# Patient Record
Sex: Female | Born: 1970 | Race: Black or African American | Hispanic: No | Marital: Single | Smoking: Never smoker
Health system: Southern US, Community
[De-identification: ages and names within clinical notes are randomized; demographics above are authoritative.]

## PROBLEM LIST (undated history)

## (undated) DIAGNOSIS — I10 Essential (primary) hypertension: Secondary | ICD-10-CM

## (undated) DIAGNOSIS — E282 Polycystic ovarian syndrome: Secondary | ICD-10-CM

## (undated) DIAGNOSIS — E119 Type 2 diabetes mellitus without complications: Secondary | ICD-10-CM

## (undated) HISTORY — PX: COLONOSCOPY: SHX174

## (undated) HISTORY — PX: UPPER GI ENDOSCOPY: SHX6162

---

## 2000-11-09 ENCOUNTER — Other Ambulatory Visit: Admission: RE | Admit: 2000-11-09 | Discharge: 2000-11-09 | Payer: Self-pay | Admitting: Gynecology

## 2001-11-03 ENCOUNTER — Encounter: Payer: Self-pay | Admitting: Emergency Medicine

## 2001-11-03 ENCOUNTER — Emergency Department (HOSPITAL_COMMUNITY): Admission: EM | Admit: 2001-11-03 | Discharge: 2001-11-03 | Payer: Self-pay | Admitting: Emergency Medicine

## 2002-04-15 ENCOUNTER — Other Ambulatory Visit: Admission: RE | Admit: 2002-04-15 | Discharge: 2002-04-15 | Payer: Self-pay | Admitting: Gynecology

## 2004-06-18 ENCOUNTER — Other Ambulatory Visit: Admission: RE | Admit: 2004-06-18 | Discharge: 2004-06-18 | Payer: Self-pay | Admitting: Gynecology

## 2004-08-25 ENCOUNTER — Emergency Department (HOSPITAL_COMMUNITY): Admission: EM | Admit: 2004-08-25 | Discharge: 2004-08-25 | Payer: Self-pay | Admitting: Family Medicine

## 2004-08-30 ENCOUNTER — Ambulatory Visit (HOSPITAL_COMMUNITY): Admission: RE | Admit: 2004-08-30 | Discharge: 2004-08-30 | Payer: Self-pay | Admitting: Family Medicine

## 2004-08-30 ENCOUNTER — Emergency Department (HOSPITAL_COMMUNITY): Admission: EM | Admit: 2004-08-30 | Discharge: 2004-08-30 | Payer: Self-pay | Admitting: Family Medicine

## 2005-03-06 ENCOUNTER — Emergency Department (HOSPITAL_COMMUNITY): Admission: EM | Admit: 2005-03-06 | Discharge: 2005-03-06 | Payer: Self-pay | Admitting: Family Medicine

## 2006-03-13 ENCOUNTER — Encounter: Admission: RE | Admit: 2006-03-13 | Discharge: 2006-03-13 | Payer: Self-pay | Admitting: Gynecology

## 2006-03-13 ENCOUNTER — Other Ambulatory Visit: Admission: RE | Admit: 2006-03-13 | Discharge: 2006-03-13 | Payer: Self-pay | Admitting: Gynecology

## 2007-07-30 ENCOUNTER — Other Ambulatory Visit: Admission: RE | Admit: 2007-07-30 | Discharge: 2007-07-30 | Payer: Self-pay | Admitting: Gynecology

## 2012-11-03 ENCOUNTER — Emergency Department (HOSPITAL_BASED_OUTPATIENT_CLINIC_OR_DEPARTMENT_OTHER): Payer: PRIVATE HEALTH INSURANCE

## 2012-11-03 ENCOUNTER — Encounter (HOSPITAL_BASED_OUTPATIENT_CLINIC_OR_DEPARTMENT_OTHER): Payer: Self-pay | Admitting: *Deleted

## 2012-11-03 ENCOUNTER — Emergency Department (HOSPITAL_BASED_OUTPATIENT_CLINIC_OR_DEPARTMENT_OTHER)
Admission: EM | Admit: 2012-11-03 | Discharge: 2012-11-03 | Disposition: A | Discharge status: Home / Self Care | Payer: PRIVATE HEALTH INSURANCE | Attending: Emergency Medicine | Admitting: Emergency Medicine

## 2012-11-03 DIAGNOSIS — Z8742 Personal history of other diseases of the female genital tract: Secondary | ICD-10-CM | POA: Insufficient documentation

## 2012-11-03 DIAGNOSIS — I10 Essential (primary) hypertension: Secondary | ICD-10-CM | POA: Insufficient documentation

## 2012-11-03 DIAGNOSIS — R112 Nausea with vomiting, unspecified: Secondary | ICD-10-CM | POA: Insufficient documentation

## 2012-11-03 DIAGNOSIS — R109 Unspecified abdominal pain: Secondary | ICD-10-CM | POA: Insufficient documentation

## 2012-11-03 DIAGNOSIS — N83209 Unspecified ovarian cyst, unspecified side: Secondary | ICD-10-CM | POA: Insufficient documentation

## 2012-11-03 DIAGNOSIS — R350 Frequency of micturition: Secondary | ICD-10-CM | POA: Insufficient documentation

## 2012-11-03 DIAGNOSIS — Z79899 Other long term (current) drug therapy: Secondary | ICD-10-CM | POA: Insufficient documentation

## 2012-11-03 DIAGNOSIS — R3915 Urgency of urination: Secondary | ICD-10-CM | POA: Insufficient documentation

## 2012-11-03 DIAGNOSIS — E119 Type 2 diabetes mellitus without complications: Secondary | ICD-10-CM | POA: Insufficient documentation

## 2012-11-03 DIAGNOSIS — F411 Generalized anxiety disorder: Secondary | ICD-10-CM | POA: Insufficient documentation

## 2012-11-03 DIAGNOSIS — Z3202 Encounter for pregnancy test, result negative: Secondary | ICD-10-CM | POA: Insufficient documentation

## 2012-11-03 HISTORY — DX: Essential (primary) hypertension: I10

## 2012-11-03 HISTORY — DX: Type 2 diabetes mellitus without complications: E11.9

## 2012-11-03 HISTORY — DX: Polycystic ovarian syndrome: E28.2

## 2012-11-03 LAB — COMPREHENSIVE METABOLIC PANEL
ALT: 15 U/L (ref 0–35)
AST: 12 U/L (ref 0–37)
Albumin: 3.9 g/dL (ref 3.5–5.2)
CO2: 25 mEq/L (ref 19–32)
Calcium: 10.2 mg/dL (ref 8.4–10.5)
Chloride: 97 mEq/L (ref 96–112)
GFR calc non Af Amer: >90 mL/min (ref >90)
Sodium: 134 mEq/L — ABNORMAL LOW (ref 135–145)
Total Bilirubin: 0.3 mg/dL (ref 0.3–1.2)

## 2012-11-03 LAB — URINE MICROSCOPIC-ADD ON

## 2012-11-03 LAB — URINALYSIS, ROUTINE W REFLEX MICROSCOPIC
Hgb urine dipstick: NEGATIVE
Leukocytes, UA: NEGATIVE
Nitrite: NEGATIVE
Specific Gravity, Urine: 1.024 (ref 1.005–1.030)
Urobilinogen, UA: 1 mg/dL (ref 0.0–1.0)

## 2012-11-03 LAB — PREGNANCY, URINE: Preg Test, Ur: NEGATIVE

## 2012-11-03 LAB — CBC WITH DIFFERENTIAL/PLATELET
Basophils Absolute: 0 10*3/uL (ref 0.0–0.1)
Basophils Relative: 0 % (ref 0–1)
Lymphocytes Relative: 22 % (ref 12–46)
Neutro Abs: 6.3 10*3/uL (ref 1.7–7.7)
Platelets: 423 10*3/uL — ABNORMAL HIGH (ref 150–400)
RDW: 16.4 % — ABNORMAL HIGH (ref 11.5–15.5)
WBC: 8.8 10*3/uL (ref 4.0–10.5)

## 2012-11-03 MED ORDER — FENTANYL CITRATE 0.05 MG/ML IJ SOLN
100.0000 ug | Freq: Once | INTRAMUSCULAR | Status: AC
  Administered 2012-11-03: 100 ug via INTRAVENOUS
  Filled 2012-11-03: qty 2

## 2012-11-03 MED ORDER — KETOROLAC TROMETHAMINE 30 MG/ML IJ SOLN
15.0000 mg | Freq: Once | INTRAMUSCULAR | Status: AC
  Administered 2012-11-03: 15 mg via INTRAVENOUS
  Filled 2012-11-03: qty 1

## 2012-11-03 MED ORDER — ONDANSETRON HCL 4 MG PO TABS: 4.0000 mg | ORAL_TABLET | Freq: Three times a day (TID) | ORAL | Status: DC | PRN

## 2012-11-03 MED ORDER — OXYCODONE-ACETAMINOPHEN 7.5-325 MG PO TABS: 1.0000 | ORAL_TABLET | ORAL | Status: DC | PRN

## 2012-11-03 MED ORDER — ONDANSETRON HCL 4 MG/2ML IJ SOLN
4.0000 mg | Freq: Once | INTRAMUSCULAR | Status: AC
  Administered 2012-11-03: 4 mg via INTRAVENOUS
  Filled 2012-11-03: qty 2

## 2012-11-03 NOTE — ED Provider Notes (Signed)
History  This chart was scribed for Nelia Shi, MD by Ardelia Mems, ED Scribe.   CSN: [id number] Arrival date & time 11/03/12  1422  First MD Initiated Contact with Patient 11/03/12 1543     Chief Complaint  Patient presents with  . Flank Pain     The history is provided by the patient. No language interpreter was used.   HPI Comments: Pamela Reese is a 42 y.o. Female with a hx of DM and HTN who presents to the Emergency Department complaining of constant, dull, right flank pain onset 2 days ago, with associated nausea and vomiting. She states that she is able to eat, but reports emesis 10 minutes after she eats, and she states that she can't keep her medications for HTN, diabetes and anxiety down. She also reports associated urinary frequency and urgency. She states that had similar flank pain about 5 years ago which subsided on its own. She states she has fibroids and polycystic ovaries. Pt denies hx of kidney stones, or gallbladder surgery. She states she suspected her pain was cramps, and she tried to sleep with legs elevated, without relief. Her pain is worsened while lying supine, and made better by walking. Pt states that she has taken Tylenol and Aspirin without relief of pain. She denies fever, chills, hematuria, left flank pain, abdominal pain or any other symptoms. She denies alcohol use and denies any hx of smoking.   Past Medical History  Diagnosis Date  . Diabetes mellitus without complication   . Hypertension   . Polycystic ovaries    History reviewed. No pertinent past surgical history. No family history on file. History  Substance Use Topics  . Smoking status: Never Smoker   . Smokeless tobacco: Not on file  . Alcohol Use: Not on file   OB History   Grav Para Term Preterm Abortions TAB SAB Ect Mult Living                 Review of Systems  Constitutional: Negative for fever and chills.  Respiratory: Negative for shortness of breath.   Cardiovascular:  Negative for chest pain.  Gastrointestinal: Positive for nausea and vomiting. Negative for abdominal pain.  Genitourinary: Positive for urgency, frequency and flank pain. Negative for dysuria.  A complete 10 system review of systems was obtained and all systems are negative except as noted in the HPI and PMH.   Allergies  Review of patient's allergies indicates no known allergies.  Home Medications   Current Outpatient Rx  Name  Route  Sig  Dispense  Refill  . benazepril (LOTENSIN) 40 MG tablet   Oral   Take 40 mg by mouth daily.         Marland Kitchen FLUoxetine (PROZAC) 10 MG capsule   Oral   Take 10 mg by mouth daily.         . metFORMIN (GLUCOPHAGE) 1000 MG tablet   Oral   Take 1,000 mg by mouth 2 (two) times daily with a meal.         . spironolactone (ALDACTONE) 100 MG tablet   Oral   Take 100 mg by mouth daily.          Triage Vitals: BP 154/89  Pulse 119  Temp(Src) 98.3 F (36.8 C) (Oral)  Resp 18  Ht 5\' 2"  (1.575 m)  Wt 298 lb (135.172 kg)  BMI 54.49 kg/m2  SpO2 98%  Physical Exam  Nursing note and vitals reviewed. Constitutional: She is oriented to  person, place, and time. She appears well-developed and well-nourished. No distress.  HENT:  Head: Normocephalic and atraumatic.  Eyes: Pupils are equal, round, and reactive to light.  Neck: Normal range of motion.  Cardiovascular: Normal rate and intact distal pulses.   Pulmonary/Chest: No respiratory distress.  Abdominal: Normal appearance. She exhibits no distension and no mass. There is no tenderness. There is no rebound and no guarding.  No right upper quadrant pain to deep palpation or inspiration.  Musculoskeletal: Normal range of motion.  Neurological: She is alert and oriented to person, place, and time. No cranial nerve deficit.  Skin: Skin is warm and dry. No rash noted.  Psychiatric: She has a normal mood and affect. Her behavior is normal.    ED Course  Procedures (including critical care  time) Medications  fentaNYL (SUBLIMAZE) injection 100 mcg (100 mcg Intravenous Given 11/03/12 1627)  ondansetron (ZOFRAN) injection 4 mg (4 mg Intravenous Given 11/03/12 1625)  ketorolac (TORADOL) 30 MG/ML injection 15 mg (15 mg Intravenous Given 11/03/12 1625)   DIAGNOSTIC STUDIES: Oxygen Saturation is 98% on RA, normal by my interpretation.    COORDINATION OF CARE: 3:50 PM- Pt informed of suspicion of a kidney stone and advised of plan for pain management along with diagnostic lab work and radiology and pt agrees. 5:18 PM- Recheck with pt and pt advised of lab and radiology results and interpretation. Pt informed that she does not have a kidney stone. Pt informed that she has bacteria in her urine, which will be cultured. Pt informed of right adnexal cyst, which may be causing her pain. Pt advised of plan to receive a prescription for pain medication upon discharge and pt agrees. Pt requests medication for nausea. Pt also agrees to follow-up with her PCP/gynecologist next week.   Medications  fentaNYL (SUBLIMAZE) injection 100 mcg (100 mcg Intravenous Given 11/03/12 1627)  ondansetron (ZOFRAN) injection 4 mg (4 mg Intravenous Given 11/03/12 1625)  ketorolac (TORADOL) 30 MG/ML injection 15 mg (15 mg Intravenous Given 11/03/12 1625)    Labs Reviewed  URINALYSIS, ROUTINE W REFLEX MICROSCOPIC - Abnormal; Notable for the following:    APPearance CLOUDY (*)    Glucose, UA 100 (*)    Protein, ur 100 (*)    All other components within normal limits  URINE MICROSCOPIC-ADD ON - Abnormal; Notable for the following:    Squamous Epithelial / LPF MANY (*)    Bacteria, UA MANY (*)    Casts HYALINE CASTS (*)    All other components within normal limits  CBC WITH DIFFERENTIAL - Abnormal; Notable for the following:    Hemoglobin 11.6 (*)    HCT 35.2 (*)    MCV 73.6 (*)    MCH 24.3 (*)    RDW 16.4 (*)    Platelets 423 (*)    All other components within normal limits  COMPREHENSIVE METABOLIC PANEL -  Abnormal; Notable for the following:    Sodium 134 (*)    Glucose, Bld 209 (*)    Total Protein 8.7 (*)    All other components within normal limits  URINE CULTURE  PREGNANCY, URINE  LIPASE, BLOOD   Ct Abdomen Pelvis Wo Contrast  11/03/2012   *RADIOLOGY REPORT*  Clinical Data: Right flank pain.  CT ABDOMEN AND PELVIS WITHOUT CONTRAST  Technique:  Multidetector CT imaging of the abdomen and pelvis was performed following the standard protocol without intravenous contrast.  Comparison: CT abdomen and pelvis 12/08/2006 and pelvic ultrasound 10/01/2012.  Findings: The lung bases  are clear.  No pleural or pericardial effusion.  No renal or ureteral stones are identified.  There is no hydronephrosis on the right or left.  The kidneys and urinary bladder appear normal.  The liver, gallbladder, spleen, adrenal glands and pancreas appear normal.  The stomach, small and large bowel and appendix are unremarkable.  IUD is seen the uterus.  Bilobed cystic structure is identified in the pelvis in the adnexa.  Right side component measures 4.7 cm transverse by 4.8 cm AP by 4.8 cm cranial-caudal. Left side component measures 3.0 cm transverse by 2.7 cm cranial- caudal. The right side the right side cystic component was present on the 2008 CT scan and appears unchanged.  No lymphadenopathy or fluid is identified.  IMPRESSION:  1.  Negative urinary tract stone.  No acute finding. 2.  Cystic adnexal structure correlates with finding described on the pelvic ultrasound 10/01/2012.  As noted on that study,  the lesion may represent a paraovarian cyst, enteric duplication cyst or lymphangioma.  As noted on that study, pelvic MRI with contrast could be used for further evaluation.   Original Report Authenticated By: Holley Dexter, M.D.    1. Flank pain   2. Ovarian cyst     MDM         I personally performed the services described in this documentation, which was scribed in my presence. The recorded information  has been reviewed and considered.    Nelia Shi, MD 11/14/12 [phone]

## 2012-11-03 NOTE — ED Notes (Signed)
Flank pain on right side for 2 days. N/V

## 2012-11-05 LAB — URINE CULTURE
Colony Count: NO GROWTH
Culture: NO GROWTH

## 2014-11-04 ENCOUNTER — Emergency Department (HOSPITAL_BASED_OUTPATIENT_CLINIC_OR_DEPARTMENT_OTHER)
Admission: EM | Admit: 2014-11-04 | Discharge: 2014-11-04 | Disposition: A | Discharge status: Home / Self Care | Payer: BLUE CROSS/BLUE SHIELD | Attending: Emergency Medicine | Admitting: Emergency Medicine

## 2014-11-04 ENCOUNTER — Encounter (HOSPITAL_BASED_OUTPATIENT_CLINIC_OR_DEPARTMENT_OTHER): Payer: Self-pay | Admitting: *Deleted

## 2014-11-04 DIAGNOSIS — Z86010 Personal history of colonic polyps: Secondary | ICD-10-CM | POA: Insufficient documentation

## 2014-11-04 DIAGNOSIS — I10 Essential (primary) hypertension: Secondary | ICD-10-CM | POA: Insufficient documentation

## 2014-11-04 DIAGNOSIS — E119 Type 2 diabetes mellitus without complications: Secondary | ICD-10-CM | POA: Diagnosis not present

## 2014-11-04 DIAGNOSIS — E860 Dehydration: Secondary | ICD-10-CM | POA: Insufficient documentation

## 2014-11-04 DIAGNOSIS — Z79899 Other long term (current) drug therapy: Secondary | ICD-10-CM | POA: Insufficient documentation

## 2014-11-04 DIAGNOSIS — R112 Nausea with vomiting, unspecified: Secondary | ICD-10-CM | POA: Diagnosis present

## 2014-11-04 LAB — COMPREHENSIVE METABOLIC PANEL
ALBUMIN: 4 g/dL (ref 3.5–5.0)
ALK PHOS: 86 U/L (ref 38–126)
ALT: 24 U/L (ref 14–54)
ANION GAP: 10 (ref 5–15)
AST: 23 U/L (ref 15–41)
BILIRUBIN TOTAL: 0.7 mg/dL (ref 0.3–1.2)
BUN: 9 mg/dL (ref 6–20)
CALCIUM: 9.2 mg/dL (ref 8.9–10.3)
CO2: 24 mmol/L (ref 22–32)
CREATININE: 0.97 mg/dL (ref 0.44–1.00)
Chloride: 98 mmol/L — ABNORMAL LOW (ref 101–111)
GFR calc Af Amer: >60 mL/min (ref >60)
GFR calc non Af Amer: >60 mL/min (ref >60)
GLUCOSE: 214 mg/dL — AB (ref 65–99)
POTASSIUM: 4 mmol/L (ref 3.5–5.1)
SODIUM: 132 mmol/L — AB (ref 135–145)
TOTAL PROTEIN: 8.7 g/dL — AB (ref 6.5–8.1)

## 2014-11-04 LAB — URINALYSIS, ROUTINE W REFLEX MICROSCOPIC
Glucose, UA: >1000 mg/dL — AB
HGB URINE DIPSTICK: NEGATIVE
KETONES UR: 40 mg/dL — AB
Leukocytes, UA: NEGATIVE
Nitrite: NEGATIVE
Protein, ur: 30 mg/dL — AB
SPECIFIC GRAVITY, URINE: 1.046 — AB (ref 1.005–1.030)
UROBILINOGEN UA: 1 mg/dL (ref 0.0–1.0)
pH: 5.5 (ref 5.0–8.0)

## 2014-11-04 LAB — LIPASE, BLOOD: LIPASE: 31 U/L (ref 22–51)

## 2014-11-04 LAB — CBC
HCT: 39.3 % (ref 36.0–46.0)
HEMOGLOBIN: 12.9 g/dL (ref 12.0–15.0)
MCH: 24.3 pg — AB (ref 26.0–34.0)
MCHC: 32.8 g/dL (ref 30.0–36.0)
MCV: 74.2 fL — AB (ref 78.0–100.0)
PLATELETS: 365 10*3/uL (ref 150–400)
RBC: 5.3 MIL/uL — AB (ref 3.87–5.11)
RDW: 16.3 % — AB (ref 11.5–15.5)
WBC: 5.2 10*3/uL (ref 4.0–10.5)

## 2014-11-04 LAB — URINE MICROSCOPIC-ADD ON

## 2014-11-04 MED ORDER — PROMETHAZINE HCL 25 MG RE SUPP: 25.0000 mg | Freq: Four times a day (QID) | RECTAL | Status: DC | PRN

## 2014-11-04 MED ORDER — ONDANSETRON HCL 4 MG/2ML IJ SOLN
4.0000 mg | Freq: Once | INTRAMUSCULAR | Status: AC
  Administered 2014-11-04: 4 mg via INTRAVENOUS
  Filled 2014-11-04: qty 2

## 2014-11-04 MED ORDER — METOCLOPRAMIDE HCL 10 MG PO TABS: 10.0000 mg | ORAL_TABLET | Freq: Four times a day (QID) | ORAL | Status: DC

## 2014-11-04 MED ORDER — METOCLOPRAMIDE HCL 5 MG/ML IJ SOLN
10.0000 mg | Freq: Once | INTRAMUSCULAR | Status: AC
  Administered 2014-11-04: 10 mg via INTRAVENOUS
  Filled 2014-11-04: qty 2

## 2014-11-04 MED ORDER — SODIUM CHLORIDE 0.9 % IV BOLUS (SEPSIS)
1000.0000 mL | Freq: Once | INTRAVENOUS | Status: AC
  Administered 2014-11-04: 1000 mL via INTRAVENOUS

## 2014-11-04 NOTE — ED Notes (Signed)
Pt. Is very nauseated after the fluid challenge just one sip.

## 2014-11-04 NOTE — ED Provider Notes (Signed)
CSN: [id number]     Arrival date & time 11/04/14  1034 History   First MD Initiated Contact with Patient 11/04/14 1037     Chief Complaint  Patient presents with  . Abdominal Pain     (Consider location/radiation/quality/duration/timing/severity/associated sxs/prior Treatment) HPI  Pt presents with c/o nausea and vomiting.  She states she has been nauseated for the past 2 weeks but over the past 5 days she has had several episodes of emesis.  She is able to drink gatorade but not water.  Has not been able to keep down solid foods.  No diarrhea.  Last BM was approx 7 days ago.  No abdominal pain.  No dysuria, urinary frequency or urgency.  States thinking about food makes her nauseated.  Had similar symptoms in the past and had EGD and colonoscopy performed, removed a polyp from her colon and stomach- has recheck EGD scheduled for September- otherwise no abnormalities found.  No sick contacts.  No fever/chills.  No vaginal bleeding or discharge.  There are no other associated systemic symptoms, there are no other alleviating or modifying factors.   Past Medical History  Diagnosis Date  . Diabetes mellitus without complication   . Hypertension   . Polycystic ovaries    History reviewed. No pertinent past surgical history. History reviewed. No pertinent family history. History  Substance Use Topics  . Smoking status: Never Smoker   . Smokeless tobacco: Not on file  . Alcohol Use: Not on file   OB History    No data available     Review of Systems  ROS reviewed and all otherwise negative except for mentioned in HPI    Allergies  Review of patient's allergies indicates no known allergies.  Home Medications   Prior to Admission medications   Medication Sig Start Date End Date Taking? Authorizing Provider  Exenatide (BYDUREON [place]) Inject into the skin.   Yes Historical Provider, MD  sitaGLIPtin (JANUVIA) 50 MG tablet Take 50 mg by mouth daily.   Yes Historical Provider, MD   benazepril (LOTENSIN) 40 MG tablet Take 40 mg by mouth daily.    Historical Provider, MD  metoCLOPramide (REGLAN) 10 MG tablet Take 1 tablet (10 mg total) by mouth every 6 (six) hours. 11/04/14   Jerelyn ScottMartha Linker, MD  ondansetron (ZOFRAN) 4 MG tablet Take 1 tablet (4 mg total) by mouth every 8 (eight) hours as needed for nausea. 11/03/12   Nelva Nayobert Beaton, MD  oxyCODONE-acetaminophen (PERCOCET) 7.5-325 MG per tablet Take 1 tablet by mouth every 4 (four) hours as needed for pain. 11/03/12   Nelva Nayobert Beaton, MD  promethazine (PHENERGAN) 25 MG suppository Place 1 suppository (25 mg total) rectally every 6 (six) hours as needed for nausea or vomiting. 11/04/14   Jerelyn ScottMartha Linker, MD  spironolactone (ALDACTONE) 100 MG tablet Take 100 mg by mouth daily.    Historical Provider, MD   BP 138/88 mmHg  Pulse 70  Temp(Src) 98 F (36.7 C) (Oral)  Resp 18  Ht 5\' 2"  (1.575 m)  Wt 286 lb (129.729 kg)  BMI 52.30 kg/m2  SpO2 99%  Vitals reviewed Physical Exam  Physical Examination: General appearance - alert, well appearing, and in no distress Mental status - alert, oriented to person, place, and time Eyes- no conjunctival injection, no scleral icterus Mouth - mucous membranes moist, pharynx normal without lesions Chest - clear to auscultation, no wheezes, rales or rhonchi, symmetric air entry Heart - normal rate, regular rhythm, normal S1, S2, no murmurs, rubs, clicks  or gallops Abdomen - soft, nontender, nondistended, no masses or organomegaly, nabs Neurological - alert, oriented, normal speech, no focal findings or movement disorder noted Extremities - peripheral pulses normal, no pedal edema, no clubbing or cyanosis Skin - normal coloration and turgor, no rashes  ED Course  Procedures (including critical care time) Labs Review Labs Reviewed  CBC - Abnormal; Notable for the following:    RBC 5.30 (*)    MCV 74.2 (*)    MCH 24.3 (*)    RDW 16.3 (*)    All other components within normal limits   COMPREHENSIVE METABOLIC PANEL - Abnormal; Notable for the following:    Sodium 132 (*)    Chloride 98 (*)    Glucose, Bld 214 (*)    Total Protein 8.7 (*)    All other components within normal limits  URINALYSIS, ROUTINE W REFLEX MICROSCOPIC (NOT AT [facility]) - Abnormal; Notable for the following:    Specific Gravity, Urine 1.046 (*)    Glucose, UA >1000 (*)    Bilirubin Urine SMALL (*)    Ketones, ur 40 (*)    Protein, ur 30 (*)    All other components within normal limits  LIPASE, BLOOD  URINE MICROSCOPIC-ADD ON    Imaging Review No results found.   EKG Interpretation None      MDM   Final diagnoses:  Nausea and vomiting, vomiting of unspecified type  Dehydration, mild    Pt presenting with several days of nausea and vomiting- pt treated with IV fluids, zofran- did not help much.  reglan did help with her nausea and she has been able to tolerate po challenge in the ED.  Labs are reasssuring, urine does show some concentration and ketones- should be improved after receiving IV fluids.  Pt has no hx of GERD, no gastritis or ulcer on EGD several months ago.  She has an appointment scheduled tomorrow with her GI doctor.  Abdominal exam is benign.  Discharged with strict return precautions.  Pt agreeable with plan.    Jerelyn Scott, MD 11/05/14 [phone]

## 2014-11-04 NOTE — ED Notes (Signed)
Pt. Reports feeling full just taking a sip of the gator aid of the fluid challenge.

## 2014-11-04 NOTE — ED Notes (Signed)
Patient denies pain and is resting comfortably.  

## 2014-11-04 NOTE — ED Notes (Signed)
Pt also reports constipation, "my last decent bm was over a week ago.Marland Kitchen.Marland Kitchen."

## 2014-11-04 NOTE — Discharge Instructions (Signed)
Return to the ED with any concerns including vomiting and not able to keep down liquids, abdominal pain-especially if it localizes to the right lower abdomen, difficulty breathing, fainting, decreased level of alertness/lethargy, or any other alarming symptoms

## 2014-11-04 NOTE — ED Notes (Signed)
Pt reports epigastric burning and pressure x July 7th with intermittent vomiting. Pt states "when I start to chew my food, I can feel the acid coming up..." pt states she had egd and colonoscopy in feb with polyps removed from colon and stomach. Pt states she has decreased appetite and po intake due to the burning and pressure causing constant nausea. Pt denies any pain, states "I just feel so bloated and full..."

## 2015-05-12 ENCOUNTER — Emergency Department (HOSPITAL_BASED_OUTPATIENT_CLINIC_OR_DEPARTMENT_OTHER)
Admission: EM | Admit: 2015-05-12 | Discharge: 2015-05-12 | Disposition: A | Discharge status: Home / Self Care | Payer: BLUE CROSS/BLUE SHIELD | Attending: Emergency Medicine | Admitting: Emergency Medicine

## 2015-05-12 ENCOUNTER — Encounter (HOSPITAL_BASED_OUTPATIENT_CLINIC_OR_DEPARTMENT_OTHER): Payer: Self-pay

## 2015-05-12 DIAGNOSIS — I10 Essential (primary) hypertension: Secondary | ICD-10-CM | POA: Diagnosis not present

## 2015-05-12 DIAGNOSIS — Z3202 Encounter for pregnancy test, result negative: Secondary | ICD-10-CM | POA: Insufficient documentation

## 2015-05-12 DIAGNOSIS — Z7984 Long term (current) use of oral hypoglycemic drugs: Secondary | ICD-10-CM | POA: Insufficient documentation

## 2015-05-12 DIAGNOSIS — Z79899 Other long term (current) drug therapy: Secondary | ICD-10-CM | POA: Diagnosis not present

## 2015-05-12 DIAGNOSIS — R140 Abdominal distension (gaseous): Secondary | ICD-10-CM | POA: Diagnosis not present

## 2015-05-12 DIAGNOSIS — E119 Type 2 diabetes mellitus without complications: Secondary | ICD-10-CM | POA: Insufficient documentation

## 2015-05-12 DIAGNOSIS — G43A0 Cyclical vomiting, not intractable: Secondary | ICD-10-CM | POA: Insufficient documentation

## 2015-05-12 DIAGNOSIS — Z9889 Other specified postprocedural states: Secondary | ICD-10-CM | POA: Insufficient documentation

## 2015-05-12 DIAGNOSIS — R112 Nausea with vomiting, unspecified: Secondary | ICD-10-CM | POA: Diagnosis present

## 2015-05-12 DIAGNOSIS — R1115 Cyclical vomiting syndrome unrelated to migraine: Secondary | ICD-10-CM

## 2015-05-12 LAB — COMPREHENSIVE METABOLIC PANEL
ALT: 16 U/L (ref 14–54)
ANION GAP: 12 (ref 5–15)
AST: 16 U/L (ref 15–41)
Albumin: 4.3 g/dL (ref 3.5–5.0)
Alkaline Phosphatase: 87 U/L (ref 38–126)
BUN: 9 mg/dL (ref 6–20)
CHLORIDE: 98 mmol/L — AB (ref 101–111)
CO2: 22 mmol/L (ref 22–32)
CREATININE: 0.92 mg/dL (ref 0.44–1.00)
Calcium: 9.4 mg/dL (ref 8.9–10.3)
GFR calc non Af Amer: >60 mL/min (ref >60)
Glucose, Bld: 229 mg/dL — ABNORMAL HIGH (ref 65–99)
POTASSIUM: 3.8 mmol/L (ref 3.5–5.1)
SODIUM: 132 mmol/L — AB (ref 135–145)
Total Bilirubin: 0.8 mg/dL (ref 0.3–1.2)
Total Protein: 8.9 g/dL — ABNORMAL HIGH (ref 6.5–8.1)

## 2015-05-12 LAB — URINE MICROSCOPIC-ADD ON

## 2015-05-12 LAB — CBC WITH DIFFERENTIAL/PLATELET
Basophils Absolute: 0.1 10*3/uL (ref 0.0–0.1)
Basophils Relative: 1 %
EOS ABS: 0.1 10*3/uL (ref 0.0–0.7)
EOS PCT: 1 %
HCT: 41.5 % (ref 36.0–46.0)
Hemoglobin: 13.9 g/dL (ref 12.0–15.0)
LYMPHS ABS: 3.5 10*3/uL (ref 0.7–4.0)
Lymphocytes Relative: 37 %
MCH: 24.8 pg — AB (ref 26.0–34.0)
MCHC: 33.5 g/dL (ref 30.0–36.0)
MCV: 74.1 fL — ABNORMAL LOW (ref 78.0–100.0)
Monocytes Absolute: 0.9 10*3/uL (ref 0.1–1.0)
Monocytes Relative: 9 %
NEUTROS PCT: 52 %
Neutro Abs: 4.9 10*3/uL (ref 1.7–7.7)
PLATELETS: 422 10*3/uL — AB (ref 150–400)
RBC: 5.6 MIL/uL — AB (ref 3.87–5.11)
RDW: 16.2 % — ABNORMAL HIGH (ref 11.5–15.5)
WBC: 9.4 10*3/uL (ref 4.0–10.5)

## 2015-05-12 LAB — URINALYSIS, ROUTINE W REFLEX MICROSCOPIC
Glucose, UA: >1000 mg/dL — AB
Hgb urine dipstick: NEGATIVE
Ketones, ur: >80 mg/dL — AB
LEUKOCYTES UA: NEGATIVE
NITRITE: NEGATIVE
PH: 6 (ref 5.0–8.0)
Protein, ur: 100 mg/dL — AB
SPECIFIC GRAVITY, URINE: 1.03 (ref 1.005–1.030)

## 2015-05-12 LAB — PREGNANCY, URINE: PREG TEST UR: NEGATIVE

## 2015-05-12 LAB — LIPASE, BLOOD: LIPASE: 29 U/L (ref 11–51)

## 2015-05-12 LAB — CBG MONITORING, ED: Glucose-Capillary: 218 mg/dL — ABNORMAL HIGH (ref 65–99)

## 2015-05-12 MED ORDER — METOCLOPRAMIDE HCL 5 MG/ML IJ SOLN
10.0000 mg | Freq: Once | INTRAMUSCULAR | Status: AC
  Administered 2015-05-12: 10 mg via INTRAVENOUS
  Filled 2015-05-12: qty 2

## 2015-05-12 MED ORDER — METOCLOPRAMIDE HCL 10 MG PO TABS: 10.0000 mg | ORAL_TABLET | Freq: Four times a day (QID) | ORAL | Status: DC

## 2015-05-12 MED ORDER — ONDANSETRON HCL 4 MG/2ML IJ SOLN
4.0000 mg | Freq: Once | INTRAMUSCULAR | Status: AC
  Administered 2015-05-12: 4 mg via INTRAVENOUS
  Filled 2015-05-12: qty 2

## 2015-05-12 NOTE — ED Provider Notes (Signed)
CSN: [id number]     Arrival date & time 05/12/15  1756 History   First MD Initiated Contact with Patient 05/12/15 1805     Chief Complaint  Patient presents with  . Vomiting   HPI  Pamela Reese is a 45 year old female with PMHx of HTN, PCOS and DM presenting with nausea and vomiting. She states that she began to lose her appetite approximately 2 weeks ago and had mild nausea. She states over the last 5 days her nausea has worsened and she is now vomiting all PO intake. She states that she is not able to "take even a sip of water ". Denies blood in her vomit. She endorses associated epigastric bloating but no abdominal pain. She states that she gets recurrent nausea and vomiting approximately every 3-4 months. She states that this presentation is similar to her previous episodes. She has been seen in this emergency Department before for a similar episode. She is followed by a gastroenterologist who has performed endoscopies and colonoscopies. She states that they have not found any cause of her cyclic nausea. She states that they have done gastroparesis testing which was also negative. She was given Phenergan and Zofran at her primary care provider's office today. She states that she took the Zofran but immediately vomited it up. She called her PCP office back and they instructed her to come to the emergency department for possible dehydration. She is no other complaints today. She denies fevers, chills, headaches, dizziness, syncope, visual disturbances, chest pain, shortness of breath, diarrhea, dysuria, flank pain, myalgias or arthralgias.   Past Medical History  Diagnosis Date  . Diabetes mellitus without complication (HCC)   . Hypertension   . Polycystic ovaries    Past Surgical History  Procedure Laterality Date  . Upper gi endoscopy    . Colonoscopy     No family history on file. Social History  Substance Use Topics  . Smoking status: Never Smoker   . Smokeless tobacco: None  .  Alcohol Use: None   OB History    No data available     Review of Systems  Gastrointestinal: Positive for nausea, vomiting and abdominal distention (bloating).  All other systems reviewed and are negative.     Allergies  Review of patient's allergies indicates no known allergies.  Home Medications   Prior to Admission medications   Medication Sig Start Date End Date Taking? Authorizing Provider  omeprazole (PRILOSEC) 10 MG capsule Take 10 mg by mouth daily.   Yes Historical Provider, MD  benazepril (LOTENSIN) 40 MG tablet Take 40 mg by mouth daily.    Historical Provider, MD  Exenatide (BYDUREON [place]) Inject into the skin.    Historical Provider, MD  metoCLOPramide (REGLAN) 10 MG tablet Take 1 tablet (10 mg total) by mouth every 6 (six) hours. 11/04/14   Jerelyn Scott, MD  metoCLOPramide (REGLAN) 10 MG tablet Take 1 tablet (10 mg total) by mouth every 6 (six) hours. 05/12/15   Lashaun Poch, PA-C  ondansetron (ZOFRAN) 4 MG tablet Take 1 tablet (4 mg total) by mouth every 8 (eight) hours as needed for nausea. 11/03/12   Nelva Nay, MD  oxyCODONE-acetaminophen (PERCOCET) 7.5-325 MG per tablet Take 1 tablet by mouth every 4 (four) hours as needed for pain. 11/03/12   Nelva Nay, MD  promethazine (PHENERGAN) 25 MG suppository Place 1 suppository (25 mg total) rectally every 6 (six) hours as needed for nausea or vomiting. 11/04/14   Jerelyn Scott, MD  sitaGLIPtin (  JANUVIA) 50 MG tablet Take 50 mg by mouth daily.    Historical Provider, MD  spironolactone (ALDACTONE) 100 MG tablet Take 100 mg by mouth daily.    Historical Provider, MD   BP 131/109 mmHg  Pulse 131  Temp(Src) 98.6 F (37 C) (Oral)  Resp 16  Ht  (1.575 m)  Wt 127.007 kg  BMI 51.20 kg/m2  SpO2 100% Physical Exam  Constitutional: She appears well-developed and well-nourished. No distress.  Nontoxic-appearing, obese   HENT:  Head: Normocephalic and atraumatic.  Eyes: Conjunctivae are normal. Right eye exhibits no  discharge. Left eye exhibits no discharge. No scleral icterus.  Neck: Normal range of motion.  Cardiovascular: Normal rate, regular rhythm and normal heart sounds.   Pulmonary/Chest: Effort normal and breath sounds normal. No respiratory distress.  Abdominal: Soft. Bowel sounds are normal. She exhibits no distension. There is no tenderness. There is no rebound and no guarding.  Musculoskeletal: Normal range of motion.  Neurological: She is alert. Coordination normal.  Skin: Skin is warm and dry.  Psychiatric: She has a normal mood and affect. Her behavior is normal.  Nursing note and vitals reviewed.   ED Course  Procedures (including critical care time) Labs Review Labs Reviewed  URINALYSIS, ROUTINE W REFLEX MICROSCOPIC (NOT AT [facility]) - Abnormal; Notable for the following:    APPearance CLOUDY (*)    Glucose, UA >1000 (*)    Bilirubin Urine MODERATE (*)    Ketones, ur >80 (*)    Protein, ur 100 (*)    All other components within normal limits  CBC WITH DIFFERENTIAL/PLATELET - Abnormal; Notable for the following:    RBC 5.60 (*)    MCV 74.1 (*)    MCH 24.8 (*)    RDW 16.2 (*)    Platelets 422 (*)    All other components within normal limits  COMPREHENSIVE METABOLIC PANEL - Abnormal; Notable for the following:    Sodium 132 (*)    Chloride 98 (*)    Glucose, Bld 229 (*)    Total Protein 8.9 (*)    All other components within normal limits  URINE MICROSCOPIC-ADD ON - Abnormal; Notable for the following:    Squamous Epithelial / LPF 6-30 (*)    Bacteria, UA FEW (*)    All other components within normal limits  CBG MONITORING, ED - Abnormal; Notable for the following:    Glucose-Capillary 218 (*)    All other components within normal limits  PREGNANCY, URINE  LIPASE, BLOOD    Imaging Review No results found. I have personally reviewed and evaluated these images and lab results as part of my medical decision-making.   EKG Interpretation None      MDM   Final  diagnoses:  Non-intractable cyclical vomiting with nausea   45 year old female presenting with nausea and vomiting x 1 week. Pt has similar episodes approximately every 3-4 months. She is followed by gastrology for this. She has had negative workups in the past secondary cause. Tachycardic to 131 which resolved after IV fluids. Abdomen is soft, nontender without peritoneal signs. No abdominal distention the patient is obese. Given 1 L bolus of fluids and Zofran. Patient reports Zofran distal to improve her nausea. Given Reglan which she reports moderate relief with. She reports a history of good response to Reglan. She is tolerating water in the emergency department. Blood work is reassuring. No indication for further evaluation due to extensive workup by GI. Given Reglan prescription and encouraged  to follow up with GI. Return precautions given in discharge paperwork and discussed with pt at bedside. Pt stable for discharge     Alveta Heimlich, PA-C 05/12/15 2038  Rolland Porter, MD 05/21/15 [phone]

## 2015-05-12 NOTE — Discharge Instructions (Signed)

## 2015-05-12 NOTE — ED Notes (Signed)
PA-C at bedside 

## 2015-05-12 NOTE — ED Notes (Signed)
Pt reports history of having n/v x 5 days. Sts she has had this before. Reports abdominal distention. Has been seen by PMD. Sent for concerns of dehydration.

## 2015-05-12 NOTE — ED Notes (Signed)
PA-C at bedside, Discussing results with patient.

## 2015-05-12 NOTE — ED Notes (Signed)
Pt reports intermittent vomiting x 2 weeks, acutely worse x several days.  Went to pcp, given nausea meds, informed to come to ED now b/c of possible dehydration.  Reports abd distention but denies specific pain.

## 2015-05-30 ENCOUNTER — Emergency Department (HOSPITAL_BASED_OUTPATIENT_CLINIC_OR_DEPARTMENT_OTHER): Payer: BLUE CROSS/BLUE SHIELD

## 2015-05-30 ENCOUNTER — Emergency Department (HOSPITAL_BASED_OUTPATIENT_CLINIC_OR_DEPARTMENT_OTHER)
Admission: EM | Admit: 2015-05-30 | Discharge: 2015-05-30 | Disposition: A | Discharge status: Home / Self Care | Payer: BLUE CROSS/BLUE SHIELD | Attending: Emergency Medicine | Admitting: Emergency Medicine

## 2015-05-30 ENCOUNTER — Encounter (HOSPITAL_BASED_OUTPATIENT_CLINIC_OR_DEPARTMENT_OTHER): Payer: Self-pay | Admitting: *Deleted

## 2015-05-30 DIAGNOSIS — Z79899 Other long term (current) drug therapy: Secondary | ICD-10-CM | POA: Insufficient documentation

## 2015-05-30 DIAGNOSIS — R079 Chest pain, unspecified: Secondary | ICD-10-CM

## 2015-05-30 DIAGNOSIS — Y998 Other external cause status: Secondary | ICD-10-CM | POA: Insufficient documentation

## 2015-05-30 DIAGNOSIS — R000 Tachycardia, unspecified: Secondary | ICD-10-CM | POA: Insufficient documentation

## 2015-05-30 DIAGNOSIS — I10 Essential (primary) hypertension: Secondary | ICD-10-CM | POA: Insufficient documentation

## 2015-05-30 DIAGNOSIS — S8992XA Unspecified injury of left lower leg, initial encounter: Secondary | ICD-10-CM | POA: Insufficient documentation

## 2015-05-30 DIAGNOSIS — S29001A Unspecified injury of muscle and tendon of front wall of thorax, initial encounter: Secondary | ICD-10-CM | POA: Insufficient documentation

## 2015-05-30 DIAGNOSIS — Z7984 Long term (current) use of oral hypoglycemic drugs: Secondary | ICD-10-CM | POA: Insufficient documentation

## 2015-05-30 DIAGNOSIS — Y9389 Activity, other specified: Secondary | ICD-10-CM | POA: Diagnosis not present

## 2015-05-30 DIAGNOSIS — Y92410 Unspecified street and highway as the place of occurrence of the external cause: Secondary | ICD-10-CM | POA: Diagnosis not present

## 2015-05-30 DIAGNOSIS — S3992XA Unspecified injury of lower back, initial encounter: Secondary | ICD-10-CM | POA: Diagnosis not present

## 2015-05-30 DIAGNOSIS — E119 Type 2 diabetes mellitus without complications: Secondary | ICD-10-CM | POA: Diagnosis not present

## 2015-05-30 DIAGNOSIS — S299XXA Unspecified injury of thorax, initial encounter: Secondary | ICD-10-CM | POA: Diagnosis present

## 2015-05-30 MED ORDER — KETOROLAC TROMETHAMINE 30 MG/ML IJ SOLN
30.0000 mg | Freq: Once | INTRAMUSCULAR | Status: AC
  Administered 2015-05-30: 30 mg via INTRAVENOUS
  Filled 2015-05-30: qty 1

## 2015-05-30 MED ORDER — LORAZEPAM 2 MG/ML IJ SOLN
1.0000 mg | Freq: Once | INTRAMUSCULAR | Status: AC
  Administered 2015-05-30: 1 mg via INTRAVENOUS
  Filled 2015-05-30: qty 1

## 2015-05-30 MED ORDER — IOHEXOL 300 MG/ML  SOLN
80.0000 mL | Freq: Once | INTRAMUSCULAR | Status: AC | PRN
Start: 2015-05-30 — End: 2015-05-30
  Administered 2015-05-30: 80 mL via INTRAVENOUS

## 2015-05-30 MED ORDER — ONDANSETRON HCL 4 MG/2ML IJ SOLN
4.0000 mg | Freq: Once | INTRAMUSCULAR | Status: AC
  Administered 2015-05-30: 4 mg via INTRAVENOUS
  Filled 2015-05-30: qty 2

## 2015-05-30 MED ORDER — METHOCARBAMOL 500 MG PO TABS: 1000.0000 mg | ORAL_TABLET | Freq: Four times a day (QID) | ORAL | Status: DC | PRN

## 2015-05-30 MED ORDER — OXYCODONE-ACETAMINOPHEN 7.5-325 MG PO TABS: 1.0000 | ORAL_TABLET | ORAL | Status: DC | PRN

## 2015-05-30 MED ORDER — MORPHINE SULFATE (PF) 4 MG/ML IV SOLN
4.0000 mg | Freq: Once | INTRAVENOUS | Status: AC
  Administered 2015-05-30: 4 mg via INTRAVENOUS
  Filled 2015-05-30: qty 1

## 2015-05-30 MED ORDER — SODIUM CHLORIDE 0.9 % IV BOLUS (SEPSIS)
1000.0000 mL | Freq: Once | INTRAVENOUS | Status: AC
  Administered 2015-05-30: 1000 mL via INTRAVENOUS
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
## 2015-05-30 NOTE — Discharge Instructions (Signed)
Take 4 over the counter ibuprofen tablets 3 times a day or 2 over-the-counter naproxen tablets twice a day for pain. ° °Chest Wall Pain °Chest wall pain is pain in or around the bones and muscles of your chest. Sometimes, an injury causes this pain. Sometimes, the cause may not be known. This pain may take several weeks or longer to get better. °HOME CARE °Pay attention to any changes in your symptoms. Take these actions to help with your pain: °· Rest as told by your doctor. °· Avoid activities that cause pain. Try not to use your chest, belly (abdominal), or side muscles to lift heavy things. °· If directed, apply ice to the painful area: °¨ Put ice in a plastic bag. °¨ Place a towel between your skin and the bag. °¨ Leave the ice on for 20 minutes, 2-3 times per day. °· Take over-the-counter and prescription medicines only as told by your doctor. °· Do not use tobacco products, including cigarettes, chewing tobacco, and e-cigarettes. If you need help quitting, ask your doctor. °· Keep all follow-up visits as told by your doctor. This is important. °GET HELP IF: °· You have a fever. °· Your chest pain gets worse. °· You have new symptoms. °GET HELP RIGHT AWAY IF: °· You feel sick to your stomach (nauseous) or you throw up (vomit). °· You feel sweaty or light-headed. °· You have a cough with phlegm (sputum) or you cough up blood. °· You are short of breath. °  °This information is not intended to replace advice given to you by your health care provider. Make sure you discuss any questions you have with your health care provider. °  °Document Released: 09/21/2007 Document Revised: 12/24/2014 Document Reviewed: 06/30/2014 °[employer]vier Interactive Patient Education ©2016 [employer] ° °
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
## 2015-05-30 NOTE — ED Notes (Signed)
DC instructions reviewed with pt, discussed using Ibuprofen for pain as recommended by EDP, also safety while taking PO pain meds as prescribed by EDP. Discussed times that may require returning to the ED. Opportunity for questions provided. Teach Back Method used

## 2016-02-26 ENCOUNTER — Emergency Department (HOSPITAL_BASED_OUTPATIENT_CLINIC_OR_DEPARTMENT_OTHER): Payer: BLUE CROSS/BLUE SHIELD

## 2016-02-26 ENCOUNTER — Emergency Department (HOSPITAL_BASED_OUTPATIENT_CLINIC_OR_DEPARTMENT_OTHER)
Admission: EM | Admit: 2016-02-26 | Discharge: 2016-02-26 | Disposition: A | Discharge status: Home / Self Care | Payer: BLUE CROSS/BLUE SHIELD | Attending: Emergency Medicine | Admitting: Emergency Medicine

## 2016-02-26 ENCOUNTER — Encounter (HOSPITAL_BASED_OUTPATIENT_CLINIC_OR_DEPARTMENT_OTHER): Payer: Self-pay | Admitting: Emergency Medicine

## 2016-02-26 DIAGNOSIS — Z79899 Other long term (current) drug therapy: Secondary | ICD-10-CM | POA: Diagnosis not present

## 2016-02-26 DIAGNOSIS — Y92410 Unspecified street and highway as the place of occurrence of the external cause: Secondary | ICD-10-CM | POA: Insufficient documentation

## 2016-02-26 DIAGNOSIS — Y999 Unspecified external cause status: Secondary | ICD-10-CM | POA: Insufficient documentation

## 2016-02-26 DIAGNOSIS — Y9389 Activity, other specified: Secondary | ICD-10-CM | POA: Insufficient documentation

## 2016-02-26 DIAGNOSIS — S4992XA Unspecified injury of left shoulder and upper arm, initial encounter: Secondary | ICD-10-CM | POA: Diagnosis present

## 2016-02-26 DIAGNOSIS — M546 Pain in thoracic spine: Secondary | ICD-10-CM | POA: Insufficient documentation

## 2016-02-26 DIAGNOSIS — M25512 Pain in left shoulder: Secondary | ICD-10-CM | POA: Diagnosis not present

## 2016-02-26 DIAGNOSIS — E119 Type 2 diabetes mellitus without complications: Secondary | ICD-10-CM | POA: Insufficient documentation

## 2016-02-26 DIAGNOSIS — I10 Essential (primary) hypertension: Secondary | ICD-10-CM | POA: Diagnosis not present

## 2016-02-26 MED ORDER — ACETAMINOPHEN 500 MG PO TABS
1000.0000 mg | ORAL_TABLET | Freq: Once | ORAL | Status: AC
  Administered 2016-02-26: 1000 mg via ORAL
  Filled 2016-02-26: qty 2

## 2016-02-26 MED ORDER — CYCLOBENZAPRINE HCL 10 MG PO TABS: 10.0000 mg | ORAL_TABLET | Freq: Two times a day (BID) | ORAL | 0 refills | Status: AC | PRN

## 2016-02-26 MED ORDER — CYCLOBENZAPRINE HCL 10 MG PO TABS
10.0000 mg | ORAL_TABLET | Freq: Once | ORAL | Status: AC
  Administered 2016-02-26: 10 mg via ORAL
  Filled 2016-02-26: qty 1

## 2016-02-26 MED ORDER — IBUPROFEN 800 MG PO TABS: 800.0000 mg | ORAL_TABLET | Freq: Three times a day (TID) | ORAL | 0 refills | Status: AC

## 2016-02-26 MED ORDER — IBUPROFEN 800 MG PO TABS
800.0000 mg | ORAL_TABLET | Freq: Once | ORAL | Status: AC
  Administered 2016-02-26: 800 mg via ORAL
  Filled 2016-02-26: qty 1

## 2016-02-26 MED FILL — CYCLOBENZAPRINE 10 MG TAB: 10 | 10 days supply | Qty: 20 | Fill #0

## 2016-02-26 MED FILL — IBUPROFEN 800 MG TABLET: 800 | 7 days supply | Qty: 21 | Fill #0

## 2016-02-26 NOTE — ED Notes (Signed)
Pt in XR at this time

## 2016-02-26 NOTE — Discharge Instructions (Signed)
Take 800 mg ibuprofen 3 times daily for pain. He may also take 1000 mg of Tylenol every 6 hours. Take Flexeril twice daily for muscle stiffness. Do not drive or operate heavy machinery.
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
## 2018-12-01 ENCOUNTER — Emergency Department (HOSPITAL_BASED_OUTPATIENT_CLINIC_OR_DEPARTMENT_OTHER)
Admission: EM | Admit: 2018-12-01 | Discharge: 2018-12-01 | Disposition: A | Discharge status: Home / Self Care | Payer: BLUE CROSS/BLUE SHIELD | Attending: Emergency Medicine | Admitting: Emergency Medicine

## 2018-12-01 ENCOUNTER — Other Ambulatory Visit: Payer: Self-pay

## 2018-12-01 ENCOUNTER — Emergency Department (HOSPITAL_BASED_OUTPATIENT_CLINIC_OR_DEPARTMENT_OTHER): Payer: BLUE CROSS/BLUE SHIELD

## 2018-12-01 ENCOUNTER — Encounter (HOSPITAL_BASED_OUTPATIENT_CLINIC_OR_DEPARTMENT_OTHER): Payer: Self-pay | Admitting: Emergency Medicine

## 2018-12-01 DIAGNOSIS — Z7984 Long term (current) use of oral hypoglycemic drugs: Secondary | ICD-10-CM | POA: Diagnosis not present

## 2018-12-01 DIAGNOSIS — R1013 Epigastric pain: Secondary | ICD-10-CM | POA: Insufficient documentation

## 2018-12-01 DIAGNOSIS — Z79899 Other long term (current) drug therapy: Secondary | ICD-10-CM | POA: Diagnosis not present

## 2018-12-01 DIAGNOSIS — I10 Essential (primary) hypertension: Secondary | ICD-10-CM | POA: Insufficient documentation

## 2018-12-01 DIAGNOSIS — E860 Dehydration: Secondary | ICD-10-CM | POA: Diagnosis not present

## 2018-12-01 DIAGNOSIS — R1010 Upper abdominal pain, unspecified: Secondary | ICD-10-CM | POA: Insufficient documentation

## 2018-12-01 DIAGNOSIS — E119 Type 2 diabetes mellitus without complications: Secondary | ICD-10-CM | POA: Insufficient documentation

## 2018-12-01 DIAGNOSIS — R112 Nausea with vomiting, unspecified: Secondary | ICD-10-CM | POA: Diagnosis not present

## 2018-12-01 DIAGNOSIS — R000 Tachycardia, unspecified: Secondary | ICD-10-CM | POA: Diagnosis not present

## 2018-12-01 DIAGNOSIS — R1011 Right upper quadrant pain: Secondary | ICD-10-CM | POA: Diagnosis present

## 2018-12-01 LAB — COMPREHENSIVE METABOLIC PANEL
ALT: 21 U/L (ref 0–44)
AST: 15 U/L (ref 15–41)
Albumin: 4.1 g/dL (ref 3.5–5.0)
Alkaline Phosphatase: 96 U/L (ref 38–126)
Anion gap: 16 — ABNORMAL HIGH (ref 5–15)
BUN: 11 mg/dL (ref 6–20)
CO2: 17 mmol/L — ABNORMAL LOW (ref 22–32)
Calcium: 9.2 mg/dL (ref 8.9–10.3)
Chloride: 104 mmol/L (ref 98–111)
Creatinine, Ser: 0.91 mg/dL (ref 0.44–1.00)
GFR calc Af Amer: >60 mL/min (ref >60)
GFR calc non Af Amer: >60 mL/min (ref >60)
Glucose, Bld: 267 mg/dL — ABNORMAL HIGH (ref 70–99)
Potassium: 4 mmol/L (ref 3.5–5.1)
Sodium: 137 mmol/L (ref 135–145)
Total Bilirubin: 0.7 mg/dL (ref 0.3–1.2)
Total Protein: 9.2 g/dL — ABNORMAL HIGH (ref 6.5–8.1)

## 2018-12-01 LAB — POCT I-STAT EG7
Acid-base deficit: 5 mmol/L — ABNORMAL HIGH (ref 0.0–2.0)
Bicarbonate: 21 mmol/L (ref 20.0–28.0)
Calcium, Ion: 1.16 mmol/L (ref 1.15–1.40)
HCT: 45 % (ref 36.0–46.0)
Hemoglobin: 15.3 g/dL — ABNORMAL HIGH (ref 12.0–15.0)
O2 Saturation: 59 %
Patient temperature: 98.6
Potassium: 4.1 mmol/L (ref 3.5–5.1)
Sodium: 141 mmol/L (ref 135–145)
TCO2: 22 mmol/L (ref 22–32)
pCO2, Ven: 39.7 mmHg — ABNORMAL LOW (ref 44.0–60.0)
pH, Ven: 7.331 (ref 7.250–7.430)
pO2, Ven: 33 mmHg (ref 32.0–45.0)

## 2018-12-01 LAB — URINALYSIS, MICROSCOPIC (REFLEX): WBC, UA: NONE SEEN WBC/hpf (ref 0–5)

## 2018-12-01 LAB — CBC
HCT: 45.9 % (ref 36.0–46.0)
Hemoglobin: 14.1 g/dL (ref 12.0–15.0)
MCH: 25 pg — ABNORMAL LOW (ref 26.0–34.0)
MCHC: 30.7 g/dL (ref 30.0–36.0)
MCV: 81.4 fL (ref 80.0–100.0)
Platelets: 406 10*3/uL — ABNORMAL HIGH (ref 150–400)
RBC: 5.64 MIL/uL — ABNORMAL HIGH (ref 3.87–5.11)
RDW: 15 % (ref 11.5–15.5)
WBC: 10.8 10*3/uL — ABNORMAL HIGH (ref 4.0–10.5)
nRBC: 0 % (ref 0.0–0.2)

## 2018-12-01 LAB — URINALYSIS, ROUTINE W REFLEX MICROSCOPIC
Glucose, UA: >=500 mg/dL — AB
Ketones, ur: >80 mg/dL — AB
Leukocytes,Ua: NEGATIVE
Nitrite: NEGATIVE
Protein, ur: 30 mg/dL — AB
Specific Gravity, Urine: 1.025 (ref 1.005–1.030)
pH: 5.5 (ref 5.0–8.0)

## 2018-12-01 LAB — LIPASE, BLOOD: Lipase: 23 U/L (ref 11–51)

## 2018-12-01 LAB — PREGNANCY, URINE: Preg Test, Ur: NEGATIVE

## 2018-12-01 MED ORDER — ONDANSETRON 8 MG PO TBDP: 8.0000 mg | ORAL_TABLET | Freq: Three times a day (TID) | ORAL | 0 refills | Status: DC | PRN

## 2018-12-01 MED ORDER — SODIUM CHLORIDE 0.9 % IV BOLUS (SEPSIS)
1000.0000 mL | Freq: Once | INTRAVENOUS | Status: AC
  Administered 2018-12-01: 1000 mL via INTRAVENOUS

## 2018-12-01 MED ORDER — METOCLOPRAMIDE HCL 5 MG/ML IJ SOLN
10.0000 mg | Freq: Once | INTRAMUSCULAR | Status: AC
  Administered 2018-12-01: 12:00:00 10 mg via INTRAVENOUS
  Filled 2018-12-01: qty 2

## 2018-12-01 MED ORDER — AMLODIPINE BESYLATE 5 MG PO TABS
10.0000 mg | ORAL_TABLET | Freq: Once | ORAL | Status: DC
  Filled 2018-12-01: qty 2

## 2018-12-01 MED ORDER — HALOPERIDOL LACTATE 5 MG/ML IJ SOLN
1.0000 mg | Freq: Once | INTRAMUSCULAR | Status: AC
  Administered 2018-12-01: 1 mg via INTRAVENOUS
  Filled 2018-12-01: qty 1

## 2018-12-01 MED ORDER — SODIUM CHLORIDE 0.9 % IV SOLN: 1000.0000 mL | INTRAVENOUS | Status: DC

## 2018-12-01 MED ORDER — ONDANSETRON HCL 4 MG/2ML IJ SOLN
4.0000 mg | Freq: Once | INTRAMUSCULAR | Status: AC
  Administered 2018-12-01: 4 mg via INTRAVENOUS
  Filled 2018-12-01: qty 2

## 2018-12-01 MED ORDER — LACTATED RINGERS IV BOLUS
1000.0000 mL | Freq: Once | INTRAVENOUS | Status: AC
  Administered 2018-12-01: 1000 mL via INTRAVENOUS

## 2018-12-01 NOTE — ED Notes (Signed)
Pt states she feels much better and is ready to go. EDP made aware

## 2018-12-01 NOTE — ED Triage Notes (Signed)
Pt reports generalized abd pain since Wednesday. Vomiting and nausea since yesterday. She saw her PCP yesterday.

## 2018-12-01 NOTE — ED Notes (Signed)
Patient transported to Ultrasound 

## 2018-12-01 NOTE — Discharge Instructions (Signed)
Take the medications to help with the nausea and vomiting.  Make sure to take your blood pressure and diabetes medications.  Return to the ED for worsening symptoms.

## 2018-12-01 NOTE — ED Provider Notes (Signed)
[facility] EMERGENCY DEPARTMENT Provider Note   CSN: [id number] Arrival date & time: 12/01/18  9379    History   Chief Complaint Chief Complaint  Patient presents with   Abdominal Pain    HPI Pamela Reese is a 48 y.o. female.     HPI Patient presents to the emergency room for evaluation of abdominal pain.  Patient states she started having symptoms on Wednesday.  She was having nausea vomiting and upper abdominal pain.  She saw her primary care doctor who ordered laboratory tests and an outpatient ultrasound.  Patient was feeling better so she decided to go to [employer] and get something to eat.  Patient thinks that might of been the wrong thing to do.  Since yesterday she has been having trouble with persistent vomiting and nausea.  She has not been able to keep anything down.  She has been having pain in her upper abdomen.  She also has noted some urinary frequency but denies any fevers or chills.  No lower abdominal pain.  No vaginal bleeding or discharge. Past Medical History:  Diagnosis Date   Diabetes mellitus without complication ([place])    Hypertension    Polycystic ovaries     There are no active problems to display for this patient.   Past Surgical History:  Procedure Laterality Date   COLONOSCOPY     UPPER GI ENDOSCOPY       OB History   No obstetric history on file.      Home Medications    Prior to Admission medications   Medication Sig Start Date End Date Taking? Authorizing Provider  amLODipine (NORVASC) 10 MG tablet Take 10 mg by mouth daily.    [provider]  cyclobenzaprine (FLEXERIL) 10 MG tablet Take 1 tablet (10 mg total) by mouth 2 (two) times daily as needed for muscle spasms. 02/26/16   Gloriann Loan, PA-C  empagliflozin (JARDIANCE) 25 MG TABS tablet Take 25 mg by mouth daily.    [provider]  Exenatide (BYDUREON [place]) Inject into the skin.    [provider]  ibuprofen (ADVIL,MOTRIN) 800 MG  tablet Take 1 tablet (800 mg total) by mouth 3 (three) times daily. 02/26/16   Gloriann Loan, PA-C  ondansetron (ZOFRAN ODT) 8 MG disintegrating tablet Take 1 tablet (8 mg total) by mouth every 8 (eight) hours as needed for nausea or vomiting. 12/01/18   Dorie Rank, MD  spironolactone (ALDACTONE) 100 MG tablet Take 100 mg by mouth daily.    [provider]    Family History No family history on file.  Social History Social History   Tobacco Use   Smoking status: Never Smoker   Smokeless tobacco: Never Used  Substance Use Topics   Alcohol use: Not on file   Drug use: Not on file     Allergies   Patient has no known allergies.   Review of Systems Review of Systems  All other systems reviewed and are negative.    Physical Exam Updated Vital Signs BP (!) 175/107 (BP Location: Left Arm)    Pulse (!) 126    Temp 98.6 F (37 C) (Oral)    Resp 18    Ht 1.575 m (5\' 2" )    Wt 123.4 kg    LMP 11/08/2018    SpO2 98%    BMI 49.75 kg/m   Physical Exam Vitals signs and nursing note reviewed.  Constitutional:      General: She is not in acute distress.  Appearance: She is well-developed. She is obese.  HENT:     Head: Normocephalic and atraumatic.     Right Ear: External ear normal.     Left Ear: External ear normal.  Eyes:     General: No scleral icterus.       Right eye: No discharge.        Left eye: No discharge.     Conjunctiva/sclera: Conjunctivae normal.  Neck:     Musculoskeletal: Neck supple.     Trachea: No tracheal deviation.  Cardiovascular:     Rate and Rhythm: Normal rate and regular rhythm.  Pulmonary:     Effort: Pulmonary effort is normal. No respiratory distress.     Breath sounds: Normal breath sounds. No stridor. No wheezing or rales.  Abdominal:     General: Bowel sounds are normal. There is no distension.     Palpations: Abdomen is soft.     Tenderness: There is abdominal tenderness in the right upper quadrant and epigastric area. There  is no guarding or rebound.  Musculoskeletal:        General: No tenderness.  Skin:    General: Skin is warm and dry.     Findings: No rash.  Neurological:     Mental Status: She is alert.     Cranial Nerves: No cranial nerve deficit (no facial droop, extraocular movements intact, no slurred speech).     Sensory: No sensory deficit.     Motor: No abnormal muscle tone or seizure activity.     Coordination: Coordination normal.      ED Treatments / Results  Labs (all labs ordered are listed, but only abnormal results are displayed) Labs Reviewed  COMPREHENSIVE METABOLIC PANEL - Abnormal; Notable for the following components:      Result Value   CO2 17 (*)    Glucose, Bld 267 (*)    Total Protein 9.2 (*)    Anion gap 16 (*)    All other components within normal limits  CBC - Abnormal; Notable for the following components:   WBC 10.8 (*)    RBC 5.64 (*)    MCH 25.0 (*)    Platelets 406 (*)    All other components within normal limits  URINALYSIS, ROUTINE W REFLEX MICROSCOPIC - Abnormal; Notable for the following components:   Glucose, UA >=500 (*)    Hgb urine dipstick TRACE (*)    Bilirubin Urine SMALL (*)    Ketones, ur >80 (*)    Protein, ur 30 (*)    All other components within normal limits  URINALYSIS, MICROSCOPIC (REFLEX) - Abnormal; Notable for the following components:   Bacteria, UA FEW (*)    All other components within normal limits  POCT I-STAT EG7 - Abnormal; Notable for the following components:   pCO2, Ven 39.7 (*)    Acid-base deficit 5.0 (*)    Hemoglobin 15.3 (*)    All other components within normal limits  LIPASE, BLOOD  PREGNANCY, URINE  I-STAT VENOUS BLOOD GAS, ED    EKG EKG Interpretation  Date/Time:  Saturday December 01 2018 15:09:44 EDT Ventricular Rate:  125 PR Interval:    QRS Duration: 90 QT Interval:  323 QTC Calculation: 466 R Axis:   62 Text Interpretation:  Sinus tachycardia Borderline T wave abnormalities No significant change  since last tracing Confirmed by Linwood DibblesKnapp, Nealy Hickmon [phone]) on 12/01/2018 3:16:30 PM   Radiology [place] Abdomen Complete  Result Date: 12/01/2018 CLINICAL DATA:  Abdominal pain and nausea  and vomiting for 1 week. EXAM: ABDOMEN ULTRASOUND COMPLETE COMPARISON:  None. FINDINGS: Gallbladder: No gallstones or wall thickening visualized. No sonographic Murphy sign noted by sonographer. Common bile duct: Diameter: 2 mm, within normal limits. Liver: No focal lesion identified. Within normal limits in parenchymal echogenicity. Portal vein is patent on color Doppler imaging with normal direction of blood flow towards the liver. IVC: No abnormality visualized. Pancreas: Visualized portion unremarkable. Spleen: Size and appearance within normal limits. Right Kidney: Length: 13.1 cm. Echogenicity within normal limits. No mass or hydronephrosis visualized. Left Kidney: Length: 11.5 cm. Echogenicity within normal limits. No mass or hydronephrosis. Abdominal aorta: No aneurysm visualized. Other findings: None. IMPRESSION: Negative abdomen ultrasound. Electronically Signed   By: Danae OrleansJohn A Stahl M.D.   On: 12/01/2018 10:52    Procedures Procedures (including critical care time)  Medications Ordered in ED Medications  sodium chloride 0.9 % bolus 1,000 mL (0 mLs Intravenous Stopped 12/01/18 1155)    Followed by  0.9 %  sodium chloride infusion (0 mLs Intravenous Hold 12/01/18 0946)  amLODipine (NORVASC) tablet 10 mg (has no administration in time range)  ondansetron (ZOFRAN) injection 4 mg (4 mg Intravenous Given 12/01/18 0934)  metoCLOPramide (REGLAN) injection 10 mg (10 mg Intravenous Given 12/01/18 1229)  lactated ringers bolus 1,000 mL (0 mLs Intravenous Stopped 12/01/18 1430)  haloperidol lactate (HALDOL) injection 1 mg (1 mg Intravenous Given 12/01/18 1333)     Initial Impression / Assessment and Plan / ED Course  I have reviewed the triage vital signs and the nursing notes.  Pertinent labs & imaging results that were  available during my care of the patient were reviewed by me and considered in my medical decision making (see chart for details).  Clinical Course as of Nov 30 1516  Sat Dec 01, 2018  1220 Patient does have an anion gap acidosis.  She is on oral hypoglycemics.  I will give her another fluid bolus and check venous blood gas   [JK]  1305 Venous blood gas does not show any signs of acidosis.   [JK]  1452 Patient is feeling better now after treatment.  She feels ready for discharge.   [JK]    Clinical Course User Index [JK] Linwood DibblesKnapp, Jozlyn Schatz, MD     Patient's laboratory tests were notable for elevated ketones in the urine as well as a decrease in the bicarb.  She did have an anion gap metabolic acidosis.  Patient's venous pH did not show any evidence of acidosis after IV hydration.  I doubt DKA.  Suspect she had a metabolic acidosis with ketosis from her lack of p.o. intake.  Patient did have an ultrasound to evaluate for possible cholelithiasis/cholecystitis.  Ultrasound is reassuring.  Patient symptoms did not improve with treatment in the emergency room.  She was able to tolerate fluids and had no further vomiting.  Patient was noted to be tachycardic.  EKG demonstrates a sinus tachycardic.  Previous EKGs showed that she had similar heart rate back even a couple years ago.  Patient was given a dose of her antihypertensive agents.  She was encouraged to return to the emergency room for any worsening symptoms.  Otherwise encourage close follow-up with her primary care doctor. Final Clinical Impressions(s) / ED Diagnoses   Final diagnoses:  Nausea and vomiting, intractability of vomiting not specified, unspecified vomiting type  Dehydration  Hypertension, unspecified type  Tachycardia    ED Discharge Orders         Ordered    ondansetron (  ZOFRAN ODT) 8 MG disintegrating tablet  Every 8 hours PRN     12/01/18 1517           Linwood DibblesKnapp, Ileane Sando, MD 12/01/18 1520

## 2018-12-01 NOTE — ED Notes (Signed)
ED Provider at bedside. 

## 2019-01-13 ENCOUNTER — Emergency Department (HOSPITAL_BASED_OUTPATIENT_CLINIC_OR_DEPARTMENT_OTHER)
Admission: EM | Admit: 2019-01-13 | Discharge: 2019-01-14 | Disposition: A | Discharge status: Home / Self Care | Payer: BC Managed Care – PPO | Attending: Emergency Medicine | Admitting: Emergency Medicine

## 2019-01-13 ENCOUNTER — Emergency Department (HOSPITAL_BASED_OUTPATIENT_CLINIC_OR_DEPARTMENT_OTHER): Payer: BC Managed Care – PPO

## 2019-01-13 ENCOUNTER — Encounter (HOSPITAL_BASED_OUTPATIENT_CLINIC_OR_DEPARTMENT_OTHER): Payer: Self-pay | Admitting: *Deleted

## 2019-01-13 ENCOUNTER — Other Ambulatory Visit: Payer: Self-pay

## 2019-01-13 DIAGNOSIS — E119 Type 2 diabetes mellitus without complications: Secondary | ICD-10-CM | POA: Diagnosis not present

## 2019-01-13 DIAGNOSIS — Z79899 Other long term (current) drug therapy: Secondary | ICD-10-CM | POA: Insufficient documentation

## 2019-01-13 DIAGNOSIS — R112 Nausea with vomiting, unspecified: Secondary | ICD-10-CM | POA: Diagnosis not present

## 2019-01-13 DIAGNOSIS — I10 Essential (primary) hypertension: Secondary | ICD-10-CM | POA: Insufficient documentation

## 2019-01-13 DIAGNOSIS — R1013 Epigastric pain: Secondary | ICD-10-CM | POA: Insufficient documentation

## 2019-01-13 LAB — URINALYSIS, ROUTINE W REFLEX MICROSCOPIC
Bilirubin Urine: NEGATIVE
Glucose, UA: >=500 mg/dL — AB
Ketones, ur: >80 mg/dL — AB
Leukocytes,Ua: NEGATIVE
Nitrite: NEGATIVE
Protein, ur: NEGATIVE mg/dL
Specific Gravity, Urine: 1.015 (ref 1.005–1.030)
pH: 5.5 (ref 5.0–8.0)

## 2019-01-13 LAB — CBC WITH DIFFERENTIAL/PLATELET
Abs Immature Granulocytes: 0.04 10*3/uL (ref 0.00–0.07)
Basophils Absolute: 0.1 10*3/uL (ref 0.0–0.1)
Basophils Relative: 1 %
Eosinophils Absolute: 0 10*3/uL (ref 0.0–0.5)
Eosinophils Relative: 0 %
HCT: 46 % (ref 36.0–46.0)
Hemoglobin: 14.5 g/dL (ref 12.0–15.0)
Immature Granulocytes: 0 %
Lymphocytes Relative: 23 %
Lymphs Abs: 2.1 10*3/uL (ref 0.7–4.0)
MCH: 25.3 pg — ABNORMAL LOW (ref 26.0–34.0)
MCHC: 31.5 g/dL (ref 30.0–36.0)
MCV: 80.4 fL (ref 80.0–100.0)
Monocytes Absolute: 0.6 10*3/uL (ref 0.1–1.0)
Monocytes Relative: 6 %
Neutro Abs: 6.4 10*3/uL (ref 1.7–7.7)
Neutrophils Relative %: 70 %
Platelets: 404 10*3/uL — ABNORMAL HIGH (ref 150–400)
RBC: 5.72 MIL/uL — ABNORMAL HIGH (ref 3.87–5.11)
RDW: 15.8 % — ABNORMAL HIGH (ref 11.5–15.5)
WBC: 9.2 10*3/uL (ref 4.0–10.5)
nRBC: 0 % (ref 0.0–0.2)

## 2019-01-13 LAB — COMPREHENSIVE METABOLIC PANEL
ALT: 17 U/L (ref 0–44)
AST: 14 U/L — ABNORMAL LOW (ref 15–41)
Albumin: 4.4 g/dL (ref 3.5–5.0)
Alkaline Phosphatase: 87 U/L (ref 38–126)
Anion gap: 19 — ABNORMAL HIGH (ref 5–15)
BUN: 20 mg/dL (ref 6–20)
CO2: 18 mmol/L — ABNORMAL LOW (ref 22–32)
Calcium: 9.7 mg/dL (ref 8.9–10.3)
Chloride: 98 mmol/L (ref 98–111)
Creatinine, Ser: 1.01 mg/dL — ABNORMAL HIGH (ref 0.44–1.00)
GFR calc Af Amer: >60 mL/min (ref >60)
GFR calc non Af Amer: >60 mL/min (ref >60)
Glucose, Bld: 272 mg/dL — ABNORMAL HIGH (ref 70–99)
Potassium: 4.2 mmol/L (ref 3.5–5.1)
Sodium: 135 mmol/L (ref 135–145)
Total Bilirubin: 0.7 mg/dL (ref 0.3–1.2)
Total Protein: 9.3 g/dL — ABNORMAL HIGH (ref 6.5–8.1)

## 2019-01-13 LAB — URINALYSIS, MICROSCOPIC (REFLEX)

## 2019-01-13 LAB — PREGNANCY, URINE: Preg Test, Ur: NEGATIVE

## 2019-01-13 LAB — CBG MONITORING, ED: Glucose-Capillary: 248 mg/dL — ABNORMAL HIGH (ref 70–99)

## 2019-01-13 LAB — LIPASE, BLOOD: Lipase: 29 U/L (ref 11–51)

## 2019-01-13 MED ORDER — METOCLOPRAMIDE HCL 5 MG/ML IJ SOLN
10.0000 mg | Freq: Once | INTRAMUSCULAR | Status: AC
  Administered 2019-01-13: 23:00:00 10 mg via INTRAVENOUS
  Filled 2019-01-13: qty 2

## 2019-01-13 MED ORDER — LACTATED RINGERS IV BOLUS
1000.0000 mL | Freq: Once | INTRAVENOUS | Status: AC
  Administered 2019-01-13: 23:00:00 1000 mL via INTRAVENOUS

## 2019-01-13 MED ORDER — LACTATED RINGERS IV BOLUS
1000.0000 mL | Freq: Once | INTRAVENOUS | Status: AC
  Administered 2019-01-14: 01:00:00 1000 mL via INTRAVENOUS

## 2019-01-13 NOTE — ED Notes (Signed)
Pt taken to radiology

## 2019-01-13 NOTE — ED Provider Notes (Signed)
[facility] EMERGENCY DEPARTMENT Provider Note   CSN: [id number] Arrival date & time: 01/13/19  2216     History   Chief Complaint Chief Complaint  Patient presents with  . Emesis    HPI Pamela Reese is a 48 y.o. female.     HPI  48 year old female presents with vomiting.  She is been vomiting for the past 2 days.  She states she is been feeling poorly for about a week where she describes nausea and decreased appetite but no vomiting until a couple days ago.  No diarrhea.  She states she had very similar episode about a month ago and was seen in the hospital and then followed up with GI and was told she was constipated and needed take a laxative.  She has been feeling worse and worse and tonight her blood sugar was reading low.  She came here without any type of treatment.  She denies any headache, fever, cough, shortness of breath, chest pain or urinary symptoms.  No abdominal pain but she feels bloated.  Past Medical History:  Diagnosis Date  . Diabetes mellitus without complication (H[facility])   . Hypertension   . Polycystic ovaries     There are no active problems to display for this patient.   Past Surgical History:  Procedure Laterality Date  . COLONOSCOPY    . UPPER GI ENDOSCOPY       OB History   No obstetric history on file.      Home Medications    Prior to Admission medications   Medication Sig Start Date End Date Taking? Authorizing Provider  amLODipine (NORVASC) 10 MG tablet Take 10 mg by mouth daily.   Yes [provider]  empagliflozin (JARDIANCE) 25 MG TABS tablet Take 25 mg by mouth daily.   Yes [provider]  spironolactone (ALDACTONE) 100 MG tablet Take 100 mg by mouth daily.   Yes [provider]  cyclobenzaprine (FLEXERIL) 10 MG tablet Take 1 tablet (10 mg total) by mouth 2 (two) times daily as needed for muscle spasms. 02/26/16   Cheri Fowler, PA-C  Exenatide (BYDUREON [place]) Inject into the skin.    [provider]  ibuprofen (ADVIL,MOTRIN) 800 MG tablet Take 1 tablet (800 mg total) by mouth 3 (three) times daily. 02/26/16   Cheri Fowler, PA-C  ondansetron (ZOFRAN ODT) 8 MG disintegrating tablet Take 1 tablet (8 mg total) by mouth every 8 (eight) hours as needed for nausea or vomiting. 12/01/18   Linwood Dibbles, MD    Family History No family history on file.  Social History Social History   Tobacco Use  . Smoking status: Never Smoker  . Smokeless tobacco: Never Used  Substance Use Topics  . Alcohol use: Never    Frequency: Never  . Drug use: Never     Allergies   Patient has no known allergies.   Review of Systems Review of Systems  Constitutional: Positive for fatigue. Negative for fever.  Respiratory: Negative for shortness of breath.   Cardiovascular: Negative for chest pain.  Gastrointestinal: Positive for abdominal pain, nausea and vomiting. Negative for constipation and diarrhea.  Genitourinary: Negative for dysuria.  Neurological: Negative for headaches.  All other systems reviewed and are negative.    Physical Exam Updated Vital Signs BP (!) 150/103 (BP Location: Right Arm)   Pulse (!) 116   Temp 97.8 F (36.6 C) (Oral)   Resp 20   Ht 5\' 2"  (1.575 m)   Wt 118.8 kg  LMP 12/16/2018 (Approximate)   SpO2 100%   BMI 47.92 kg/m   Physical Exam Vitals signs and nursing note reviewed.  Constitutional:      Appearance: She is well-developed. She is obese.  HENT:     Head: Normocephalic and atraumatic.     Right Ear: External ear normal.     Left Ear: External ear normal.     Nose: Nose normal.  Eyes:     General:        Right eye: No discharge.        Left eye: No discharge.  Cardiovascular:     Rate and Rhythm: Regular rhythm. Tachycardia present.     Heart sounds: Normal heart sounds.  Pulmonary:     Effort: Pulmonary effort is normal.     Breath sounds: Normal breath sounds.  Abdominal:     Palpations: Abdomen is soft.     Tenderness: There is  abdominal tenderness in the epigastric area.  Skin:    General: Skin is warm and dry.  Neurological:     Mental Status: She is alert.  Psychiatric:        Mood and Affect: Mood is not anxious.      ED Treatments / Results  Labs (all labs ordered are listed, but only abnormal results are displayed) Labs Reviewed  CBG MONITORING, ED - Abnormal; Notable for the following components:      Result Value   Glucose-Capillary 248 (*)    All other components within normal limits  COMPREHENSIVE METABOLIC PANEL  PREGNANCY, URINE  URINALYSIS, ROUTINE W REFLEX MICROSCOPIC  LIPASE, BLOOD  CBC WITH DIFFERENTIAL/PLATELET    EKG EKG Interpretation  Date/Time:  Sunday January 13 2019 23:07:15 EDT Ventricular Rate:  115 PR Interval:    QRS Duration: 88 QT Interval:  344 QTC Calculation: 476 R Axis:   54 Text Interpretation:  Sinus tachycardia Borderline T wave abnormalities Baseline wander in lead(s) II aVR V2 no significant change since Aug 2020 Confirmed by Sherwood Gambler [phone]) on 01/13/2019 11:14:39 PM   Radiology No results found.  Procedures Procedures (including critical care time)  Medications Ordered in ED Medications  lactated ringers bolus 1,000 mL (has no administration in time range)  metoCLOPramide (REGLAN) injection 10 mg (has no administration in time range)     Initial Impression / Assessment and Plan / ED Course  I have reviewed the triage vital signs and the nursing notes.  Pertinent labs & imaging results that were available during my care of the patient were reviewed by me and considered in my medical decision making (see chart for details).        Patient with multiple episodes of vomiting and abdominal bloating.  Abdominal exam shows some mild epigastric tenderness but is more that it makes her want to vomit then true tenderness.  This is very similar to August 2020.  At this point, will send labs, give fluids and antiemetics and reassess.  Care  transferred to Dr. Dina Rich.  Final Clinical Impressions(s) / ED Diagnoses   Final diagnoses:  None    ED Discharge Orders    None       Sherwood Gambler, MD 01/13/19 2354

## 2019-01-13 NOTE — ED Notes (Signed)
Imaging after u preg results 

## 2019-01-13 NOTE — ED Triage Notes (Signed)
Pt reports she has been feeling bad since Wednesday. Started vomiting Thursday. States she feels weak and has been sweating. C/o bloatng. States she checked her blood sugar tonight and it read 28 on her home meter. Pt did not eat or drink after that before coming to the ED due to nausea

## 2019-01-14 ENCOUNTER — Encounter (HOSPITAL_BASED_OUTPATIENT_CLINIC_OR_DEPARTMENT_OTHER): Payer: Self-pay

## 2019-01-14 ENCOUNTER — Emergency Department (HOSPITAL_BASED_OUTPATIENT_CLINIC_OR_DEPARTMENT_OTHER): Payer: BC Managed Care – PPO

## 2019-01-14 LAB — BASIC METABOLIC PANEL
Anion gap: 15 (ref 5–15)
BUN: 15 mg/dL (ref 6–20)
CO2: 19 mmol/L — ABNORMAL LOW (ref 22–32)
Calcium: 9.2 mg/dL (ref 8.9–10.3)
Chloride: 100 mmol/L (ref 98–111)
Creatinine, Ser: 0.7 mg/dL (ref 0.44–1.00)
GFR calc Af Amer: >60 mL/min (ref >60)
GFR calc non Af Amer: >60 mL/min (ref >60)
Glucose, Bld: 262 mg/dL — ABNORMAL HIGH (ref 70–99)
Potassium: 4.7 mmol/L (ref 3.5–5.1)
Sodium: 134 mmol/L — ABNORMAL LOW (ref 135–145)

## 2019-01-14 MED ORDER — IOHEXOL 300 MG/ML  SOLN
100.0000 mL | Freq: Once | INTRAMUSCULAR | Status: AC | PRN
  Administered 2019-01-14: 03:00:00 100 mL via INTRAVENOUS

## 2019-01-14 MED ORDER — ONDANSETRON HCL 4 MG/2ML IJ SOLN
4.0000 mg | Freq: Once | INTRAMUSCULAR | Status: AC
  Administered 2019-01-14: 01:00:00 4 mg via INTRAVENOUS
  Filled 2019-01-14: qty 2

## 2019-01-14 MED ORDER — METOCLOPRAMIDE HCL 10 MG PO TABS: 10.0000 mg | ORAL_TABLET | Freq: Three times a day (TID) | ORAL | 0 refills | Status: AC | PRN

## 2019-01-14 NOTE — ED Notes (Signed)
Pt returned from CT scan.

## 2019-01-14 NOTE — ED Provider Notes (Signed)
Patient signed out pending work-up.  Lab work notable for evidence of dehydration with greater than 80 ketones in the urine.  She does have an anion gap of 19.  She is a type II diabetic on oral meds.  She previously had similar BMP but a reassuring VBG.  Given concern for dehydration, suspect this may be the culprit and not DKA.  Patient was given several liters of fluid.  She did have some persistent nausea and vomiting while the emergency room.  CT scan was obtained to rule out intra-abdominal process.  CT scan does not show any pathology.  She had mild persistent tachycardia.  Repeat BMP with resolved anion gap.  Suspect her metabolic derangements likely secondary to dehydration.  I discussed with the patient that I have high suspicion that she may be experiencing some gastroparesis given her uncontrolled diabetes.  We will start her on Reglan and have encouraged her to follow-up with gastroenterology.  Additionally she needs to follow-up with her primary physician for recheck of her blood sugars and titration of medications.  Patient stated understanding.  Clinical Course as of Jan 13 534  Mon Jan 14, 2019  0321 On multiple rechecks, patient with persistent bloated feeling and recurrent emesis after antiemetics.  Will obtain CT of the abdomen.   [CH]    Clinical Course User Index [CH] Daemon Dowty, Barbette Hair, MD   Problem List Items Addressed This Visit    None    Visit Diagnoses    Non-intractable vomiting with nausea, unspecified vomiting type    -  Primary        Merryl Hacker, MD 01/14/19 [phone]

## 2019-01-14 NOTE — ED Notes (Signed)
MD with pt  

## 2019-01-14 NOTE — ED Notes (Signed)
Pt ambulatory to bathroom without difficulty.  

## 2019-01-14 NOTE — Discharge Instructions (Addendum)
You were seen today for nausea and vomiting.  Your work-up is overall reassuring.  Given your diabetes, you may have some element of gastroparesis.  Take medications as prescribed.  Follow-up closely with your primary physician.  He also need to follow-up with your gastroenterologist.

## 2019-01-14 NOTE — ED Notes (Signed)
Dr. Dina Rich aware that pt's heart rate remains in the 120s. ST. No further orders noted.

## 2019-08-18 ENCOUNTER — Other Ambulatory Visit: Payer: Self-pay

## 2019-08-18 ENCOUNTER — Encounter (HOSPITAL_BASED_OUTPATIENT_CLINIC_OR_DEPARTMENT_OTHER): Payer: Self-pay

## 2019-08-18 ENCOUNTER — Emergency Department (HOSPITAL_BASED_OUTPATIENT_CLINIC_OR_DEPARTMENT_OTHER)
Admission: EM | Admit: 2019-08-18 | Discharge: 2019-08-18 | Disposition: A | Discharge status: Home / Self Care | Payer: BC Managed Care – PPO | Attending: Emergency Medicine | Admitting: Emergency Medicine

## 2019-08-18 DIAGNOSIS — R109 Unspecified abdominal pain: Secondary | ICD-10-CM

## 2019-08-18 DIAGNOSIS — Z79899 Other long term (current) drug therapy: Secondary | ICD-10-CM | POA: Diagnosis not present

## 2019-08-18 DIAGNOSIS — R000 Tachycardia, unspecified: Secondary | ICD-10-CM | POA: Diagnosis not present

## 2019-08-18 DIAGNOSIS — E119 Type 2 diabetes mellitus without complications: Secondary | ICD-10-CM | POA: Diagnosis not present

## 2019-08-18 DIAGNOSIS — Z886 Allergy status to analgesic agent status: Secondary | ICD-10-CM | POA: Diagnosis not present

## 2019-08-18 DIAGNOSIS — I10 Essential (primary) hypertension: Secondary | ICD-10-CM | POA: Diagnosis not present

## 2019-08-18 DIAGNOSIS — R002 Palpitations: Secondary | ICD-10-CM | POA: Diagnosis present

## 2019-08-18 DIAGNOSIS — R112 Nausea with vomiting, unspecified: Secondary | ICD-10-CM | POA: Diagnosis not present

## 2019-08-18 DIAGNOSIS — E876 Hypokalemia: Secondary | ICD-10-CM | POA: Insufficient documentation

## 2019-08-18 DIAGNOSIS — Z888 Allergy status to other drugs, medicaments and biological substances status: Secondary | ICD-10-CM | POA: Insufficient documentation

## 2019-08-18 DIAGNOSIS — R1084 Generalized abdominal pain: Secondary | ICD-10-CM | POA: Diagnosis not present

## 2019-08-18 DIAGNOSIS — Z7984 Long term (current) use of oral hypoglycemic drugs: Secondary | ICD-10-CM | POA: Diagnosis not present

## 2019-08-18 LAB — CBC
HCT: 44.5 % (ref 36.0–46.0)
Hemoglobin: 14.5 g/dL (ref 12.0–15.0)
MCH: 25.9 pg — ABNORMAL LOW (ref 26.0–34.0)
MCHC: 32.6 g/dL (ref 30.0–36.0)
MCV: 79.5 fL — ABNORMAL LOW (ref 80.0–100.0)
Platelets: 342 10*3/uL (ref 150–400)
RBC: 5.6 MIL/uL — ABNORMAL HIGH (ref 3.87–5.11)
RDW: 15.7 % — ABNORMAL HIGH (ref 11.5–15.5)
WBC: 7.1 10*3/uL (ref 4.0–10.5)
nRBC: 0 % (ref 0.0–0.2)

## 2019-08-18 LAB — COMPREHENSIVE METABOLIC PANEL
ALT: 20 U/L (ref 0–44)
AST: 16 U/L (ref 15–41)
Albumin: 4.1 g/dL (ref 3.5–5.0)
Alkaline Phosphatase: 84 U/L (ref 38–126)
Anion gap: 15 (ref 5–15)
BUN: 14 mg/dL (ref 6–20)
CO2: 19 mmol/L — ABNORMAL LOW (ref 22–32)
Calcium: 9.1 mg/dL (ref 8.9–10.3)
Chloride: 97 mmol/L — ABNORMAL LOW (ref 98–111)
Creatinine, Ser: 0.81 mg/dL (ref 0.44–1.00)
GFR calc Af Amer: >60 mL/min (ref >60)
GFR calc non Af Amer: >60 mL/min (ref >60)
Glucose, Bld: 174 mg/dL — ABNORMAL HIGH (ref 70–99)
Potassium: 3.4 mmol/L — ABNORMAL LOW (ref 3.5–5.1)
Sodium: 131 mmol/L — ABNORMAL LOW (ref 135–145)
Total Bilirubin: 0.9 mg/dL (ref 0.3–1.2)
Total Protein: 8.9 g/dL — ABNORMAL HIGH (ref 6.5–8.1)

## 2019-08-18 LAB — URINALYSIS, MICROSCOPIC (REFLEX)

## 2019-08-18 LAB — URINALYSIS, ROUTINE W REFLEX MICROSCOPIC
Glucose, UA: >=500 mg/dL — AB
Ketones, ur: >80 mg/dL — AB
Leukocytes,Ua: NEGATIVE
Nitrite: NEGATIVE
Protein, ur: 30 mg/dL — AB
Specific Gravity, Urine: >1.03 — ABNORMAL HIGH (ref 1.005–1.030)
pH: 6 (ref 5.0–8.0)

## 2019-08-18 LAB — LIPASE, BLOOD: Lipase: 29 U/L (ref 11–51)

## 2019-08-18 MED ORDER — ONDANSETRON HCL 4 MG/2ML IJ SOLN
4.0000 mg | Freq: Once | INTRAMUSCULAR | Status: AC
  Administered 2019-08-18: 4 mg via INTRAVENOUS
  Filled 2019-08-18: qty 2

## 2019-08-18 MED ORDER — POTASSIUM CHLORIDE CRYS ER 20 MEQ PO TBCR
40.0000 meq | EXTENDED_RELEASE_TABLET | Freq: Once | ORAL | Status: AC
  Administered 2019-08-18: 19:00:00 40 meq via ORAL
  Filled 2019-08-18: qty 2

## 2019-08-18 MED ORDER — ONDANSETRON 4 MG PO TBDP: 4.0000 mg | ORAL_TABLET | Freq: Three times a day (TID) | ORAL | 0 refills | Status: AC | PRN

## 2019-08-18 MED ORDER — LIDOCAINE VISCOUS HCL 2 % MT SOLN
15.0000 mL | Freq: Once | OROMUCOSAL | Status: AC
  Administered 2019-08-18: 17:00:00 15 mL via ORAL
  Filled 2019-08-18: qty 15

## 2019-08-18 MED ORDER — ALUM & MAG HYDROXIDE-SIMETH 200-200-20 MG/5ML PO SUSP
30.0000 mL | Freq: Once | ORAL | Status: AC
  Administered 2019-08-18: 17:00:00 30 mL via ORAL
  Filled 2019-08-18: qty 30

## 2019-08-18 MED ORDER — FAMOTIDINE 20 MG PO TABS: 20.0000 mg | ORAL_TABLET | Freq: Two times a day (BID) | ORAL | 0 refills | Status: AC

## 2019-08-18 MED ORDER — SODIUM CHLORIDE 0.9 % IV BOLUS
1000.0000 mL | Freq: Once | INTRAVENOUS | Status: AC
  Administered 2019-08-18: 16:00:00 1000 mL via INTRAVENOUS

## 2019-08-18 NOTE — ED Notes (Signed)
Pt discharged to home. Discharge instructions have been discussed with patient and/or family members. Pt verbally acknowledges understanding d/c instructions, and endorses comprehension to checkout at registration before leaving.  °

## 2019-08-18 NOTE — ED Provider Notes (Signed)
[facility] EMERGENCY DEPARTMENT Provider Note   CSN: [id number] Arrival date & time: 08/18/19  1402     History Chief Complaint  Patient presents with   Palpitations    Pamela Reese is a 49 y.o. female with history of diabetes mellitus, hypertension, gastroparesis, polycystic ovaries presents for evaluation of acute onset, persistent nausea and vomiting beginning today.  She reports she has a history of gastroparesis and she feels that it is flaring up today.  She had similar symptoms earlier last week and was seen by her PCP for it on Wednesday, 08/14/2019 where she was given a shot of IM Phenergan which she reports was very helpful.  Blood work was done at that time which showed hyperglycemia but no evidence of DKA or HHS.  Her symptoms resolved after her visit on Wednesday but then "came back with a vengeance" per the patient today.  She was sent home with a prescription for Reglan but states she has not been able to keep this down.  She has had 5 or 6 episodes of nonbloody nonbilious emesis.  She reports feeling as though her abdomen is bloated and she only gets relief when she vomits.  She has been mildly constipated but had a bowel movement yesterday.  Denies melena or hematochezia, urinary symptoms, fevers or chills.  She denies chest pain or shortness of breath.  Denies suspicious food intake or sick contacts.   The history is provided by the patient.       Past Medical History:  Diagnosis Date   Diabetes mellitus without complication (HCC)    Hypertension    Polycystic ovaries     There are no problems to display for this patient.   Past Surgical History:  Procedure Laterality Date   COLONOSCOPY     UPPER GI ENDOSCOPY       OB History   No obstetric history on file.     No family history on file.  Social History   Tobacco Use   Smoking status: Never Smoker   Smokeless tobacco: Never Used  Substance Use Topics   Alcohol use: Never    Drug use: Never    Home Medications Prior to Admission medications   Medication Sig Start Date End Date Taking? Authorizing Provider  amLODipine (NORVASC) 10 MG tablet Take 10 mg by mouth daily.   Yes [provider]  empagliflozin (JARDIANCE) 25 MG TABS tablet Take 25 mg by mouth daily.   Yes [provider]  spironolactone (ALDACTONE) 100 MG tablet Take 100 mg by mouth daily.   Yes [provider]  cyclobenzaprine (FLEXERIL) 10 MG tablet Take 1 tablet (10 mg total) by mouth 2 (two) times daily as needed for muscle spasms. 02/26/16   Cheri Fowler, PA-C  Exenatide (BYDUREON [place]) Inject into the skin.    [provider]  famotidine (PEPCID) 20 MG tablet Take 1 tablet (20 mg total) by mouth 2 (two) times daily. 08/18/19   Casper Pagliuca A, PA-C  ibuprofen (ADVIL,MOTRIN) 800 MG tablet Take 1 tablet (800 mg total) by mouth 3 (three) times daily. 02/26/16   Cheri Fowler, PA-C  metoCLOPramide (REGLAN) 10 MG tablet Take 1 tablet (10 mg total) by mouth every 8 (eight) hours as needed for nausea or vomiting. 01/14/19   Horton, Mayer Masker, MD  ondansetron (ZOFRAN ODT) 4 MG disintegrating tablet Take 1 tablet (4 mg total) by mouth every 8 (eight) hours as needed for nausea or vomiting. 08/18/19   Jeanie Sewer,  PA-C    Allergies    Other, Metformin, Semaglutide, Ace inhibitors, and Canagliflozin-metformin hcl  Review of Systems   Review of Systems  Constitutional: Negative for chills and fever.  Respiratory: Negative for shortness of breath.   Cardiovascular: Negative for chest pain.  Gastrointestinal: Positive for abdominal pain, constipation, nausea and vomiting. Negative for diarrhea.  All other systems reviewed and are negative.   Physical Exam Updated Vital Signs BP (!) 137/93 (BP Location: Right Arm)    Pulse (!) 127    Temp 98.9 F (37.2 C) (Oral)    Resp 20    Ht 5\' 2"  (1.575 m)    Wt 129.3 kg    LMP 08/14/2019    SpO2 100%    BMI 52.13 kg/m   Physical  Exam Vitals and nursing note reviewed.  Constitutional:      General: She is not in acute distress.    Appearance: She is well-developed. She is obese.  HENT:     Head: Normocephalic and atraumatic.  Eyes:     General:        Right eye: No discharge.        Left eye: No discharge.     Conjunctiva/sclera: Conjunctivae normal.  Neck:     Vascular: No JVD.     Trachea: No tracheal deviation.  Cardiovascular:     Rate and Rhythm: Regular rhythm. Tachycardia present.     Heart sounds: Normal heart sounds.  Pulmonary:     Effort: Pulmonary effort is normal.     Breath sounds: Normal breath sounds.  Abdominal:     General: Abdomen is protuberant. Bowel sounds are normal. There is no distension.     Palpations: Abdomen is soft.     Tenderness: There is abdominal tenderness in the right upper quadrant, epigastric area and left upper quadrant. There is no right CVA tenderness, left CVA tenderness, guarding or rebound.  Skin:    General: Skin is warm and dry.     Findings: No erythema.  Neurological:     Mental Status: She is alert.  Psychiatric:        Behavior: Behavior normal.     ED Results / Procedures / Treatments   Labs (all labs ordered are listed, but only abnormal results are displayed) Labs Reviewed  COMPREHENSIVE METABOLIC PANEL - Abnormal; Notable for the following components:      Result Value   Sodium 131 (*)    Potassium 3.4 (*)    Chloride 97 (*)    CO2 19 (*)    Glucose, Bld 174 (*)    Total Protein 8.9 (*)    All other components within normal limits  CBC - Abnormal; Notable for the following components:   RBC 5.60 (*)    MCV 79.5 (*)    MCH 25.9 (*)    RDW 15.7 (*)    All other components within normal limits  URINALYSIS, ROUTINE W REFLEX MICROSCOPIC - Abnormal; Notable for the following components:   Specific Gravity, Urine >1.030 (*)    Glucose, UA >=500 (*)    Hgb urine dipstick SMALL (*)    Bilirubin Urine SMALL (*)    Ketones, ur >80 (*)     Protein, ur 30 (*)    All other components within normal limits  URINALYSIS, MICROSCOPIC (REFLEX) - Abnormal; Notable for the following components:   Bacteria, UA RARE (*)    All other components within normal limits  LIPASE, BLOOD    EKG EKG  Interpretation  Date/Time:  Sunday Aug 18 2019 14:10:23 EDT Ventricular Rate:  109 PR Interval:  140 QRS Duration: 86 QT Interval:  346 QTC Calculation: 465 R Axis:   64 Text Interpretation: Sinus tachycardia Nonspecific T wave abnormality Abnormal ECG No significant change since last tracing Confirmed by Haviland, Julie ([id number]) on 08/18/2019 2:23:15 PM Also confirmed by Haviland, Julie ([id number]), editor [place], Beverly ([id number])  on 08/19/2019 2:38:41 PM   Radiology No results found.  Procedures Procedures (including critical care time)  Medications Ordered in ED Medications  ondansetron (ZOFRAN) injection 4 mg (4 mg Intravenous Given 08/18/19 1554)  sodium chloride 0.9 % bolus 1,000 mL (0 mLs Intravenous Stopped 08/18/19 1729)  alum & mag hydroxide-simeth (MAALOX/MYLANTA) 200-200-20 MG/5ML suspension 30 mL (30 mLs Oral Given 08/18/19 1652)    And  lidocaine (XYLOCAINE) 2 % viscous mouth solution 15 mL (15 mLs Oral Given 08/18/19 1652)  potassium chloride SA (KLOR-CON) CR tablet 40 mEq (40 mEq Oral Given 08/18/19 1833)    ED Course  I have reviewed the triage vital signs and the nursing notes.  Pertinent labs & imaging results that were available during my care of the patient were reviewed by me and considered in my medical decision making (see chart for details).    MDM Rules/Calculators/A&P                      Patient presenting for evaluation of nausea, vomiting, upper abdominal pains.  She is afebrile, tachycardic in the ED but is chronically tachycardic per my assessment she frequently has heart rates of up to 130.  Vital signs are otherwise stable.  She is nontoxic in appearance.  She is upper abdominal pain with no rebound or guarding.   She feels that her symptoms are very consistent with prior gastroparesis flares.  Will obtain lab work and provide medications for symptomatic management and reassess.  Lab work reviewed and interpreted by myself shows no leukocytosis, no anemia, no renal insufficiency.  She is very mildly hypokalemic, hypochloremic and hyponatremic but anion gap is within normal limits.  I suspect these changes are due to the vomiting.  UA suggests dehydration, less concerning for UTI or nephrolithiasis.  She was given IV fluids, antiemetics in the ED and on reevaluation reports that she is feeling much better.  She is tolerating sips of fluid without difficulty.  Serial abdominal examinations remain benign.  I do not feel that she requires emergent imaging given reassuring serial exams and improvement in her symptoms with known history of gastroparesis.  She feels comfortable with discharge home.  Will discharge with symptomatic management.  Recommend follow-up with PCP or gastroenterologist for reevaluation of symptoms.  Discussed strict ED return precautions. Patient verbalized understanding of and agreement with plan and is safe for discharge home at this time.  Final Clinical Impression(s) / ED Diagnoses Final diagnoses:  Non-intractable vomiting with nausea, unspecified vomiting type  Abdominal discomfort  Sinus tachycardia  Hypokalemia    Rx / DC Orders ED Discharge Orders         Ordered    ondansetron (ZOFRAN ODT) 4 MG disintegrating tablet  Every 8 hours PRN     08/18/19 1827    famotidine (PEPCID) 20 MG tablet  2 times daily     05 /02/21 1827           08-30-1992, PA-C 08/19/19 2318    10/19/19, MD 08/21/19 1501

## 2019-08-18 NOTE — ED Triage Notes (Signed)
Pt states that she has been having nausea since Tuesday reports palpitations starting today. Pt also reports she is having cold sweats with dizziness and feels bloated.

## 2019-08-18 NOTE — Discharge Instructions (Addendum)
1. Medications: Take Zofran as needed for nausea.  Let this medicine dissolve under your tongue and wait around 10-20 minutes before eating or drinking after taking this medication.  Take famotidine twice daily with meals. 2. Treatment: rest, drink plenty of fluids, advance diet slowly.  Start with water and broth then advance to bland foods that will not upset your stomach such as crackers, mashed potatoes, and peanut butter. 3. Follow Up: Please followup with your primary doctor or gastroenterology in 3 days for discussion of your diagnoses and further evaluation after today's visit; please follow-up with your PCP to discuss your fast heart rate to see if there may need to be any additional work-up for this; Please return to the ER for persistent vomiting, high fevers or worsening symptoms

## 2020-08-23 IMAGING — US ULTRASOUND ABDOMEN COMPLETE
1 series · 14 of 25 positions shown · non-contrast
Comparison: None.

CLINICAL DATA: Abdominal pain and nausea and vomiting for 1 week.

EXAM:
ABDOMEN ULTRASOUND COMPLETE

[Series 1: ultrasound abdomen complete · 14 of 63 slices shown]
[im 1/63]
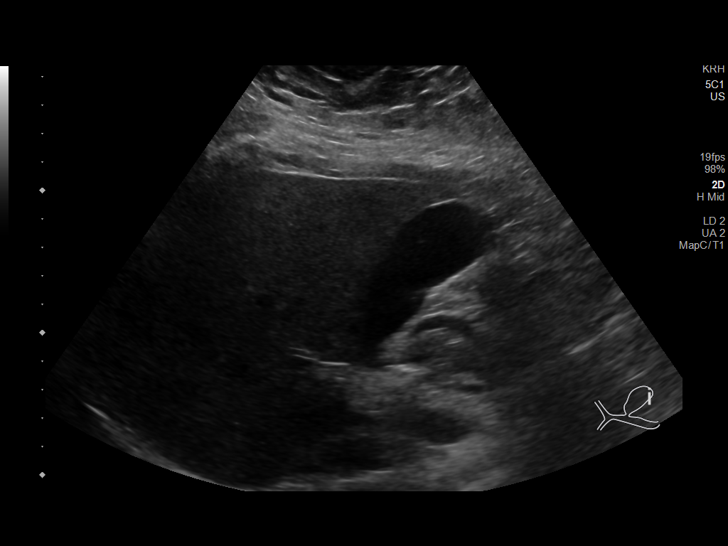
[im 6/63]
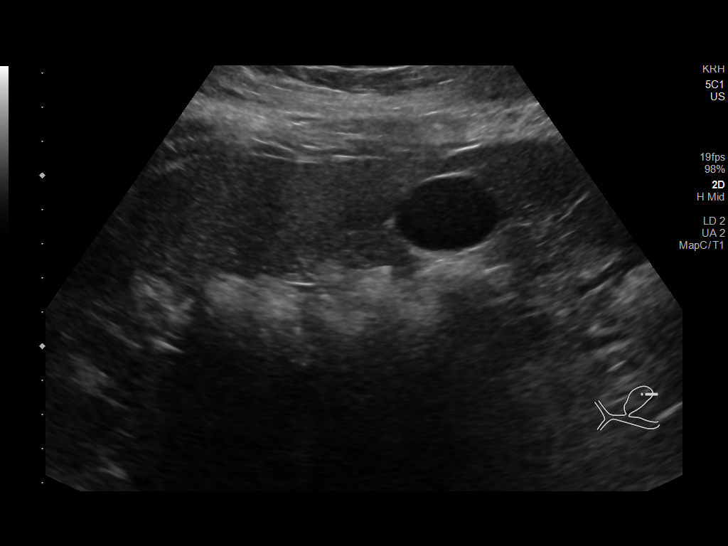
[im 11/63]
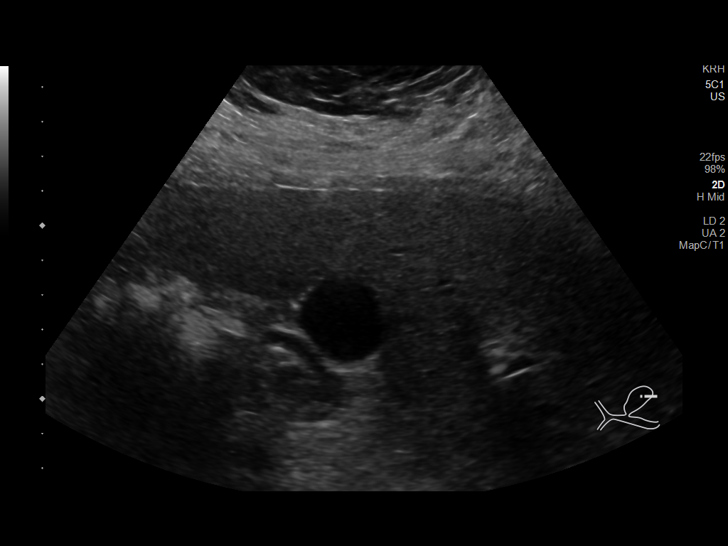
[im 16/63]
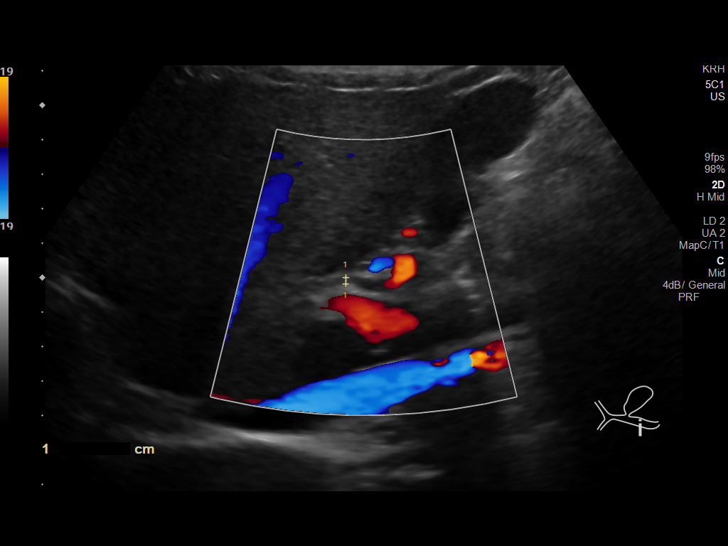
[im 21/63]
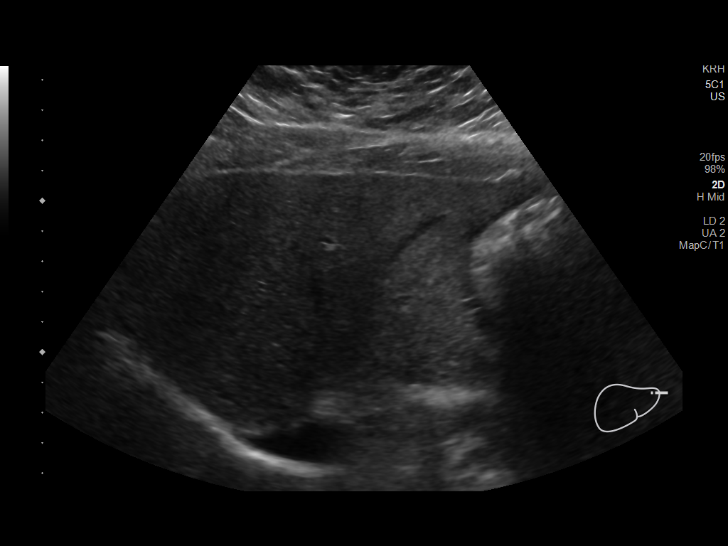
[im 24/63]
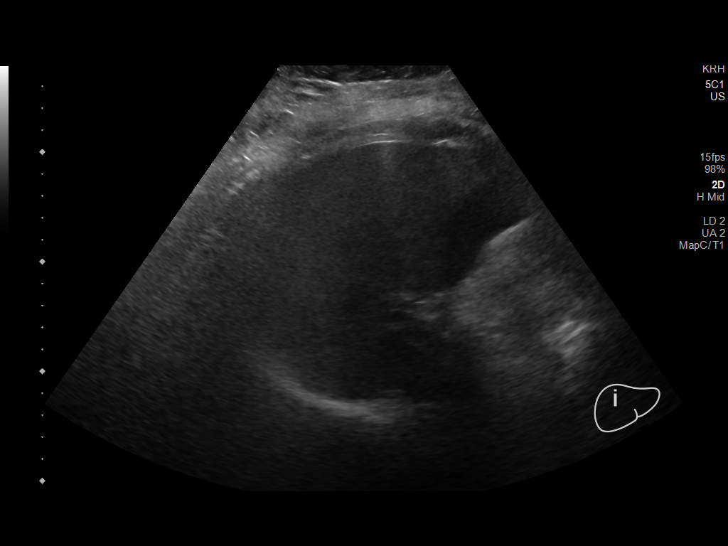
[im 29/63]
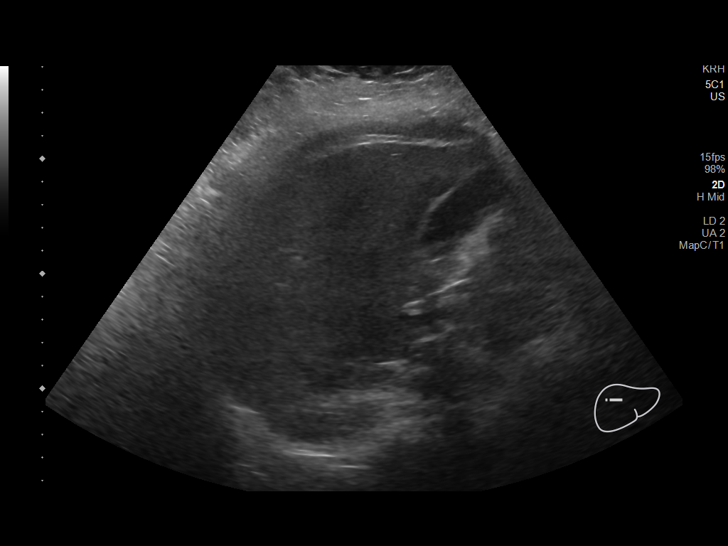
[im 34/63]
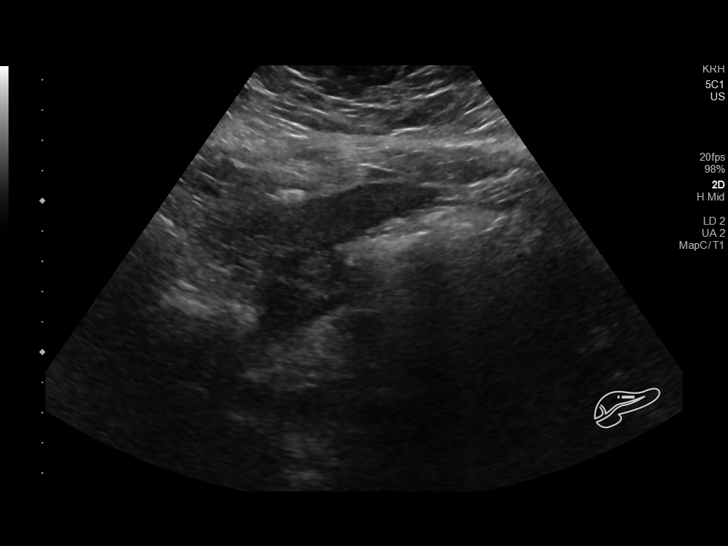
[im 39/63]
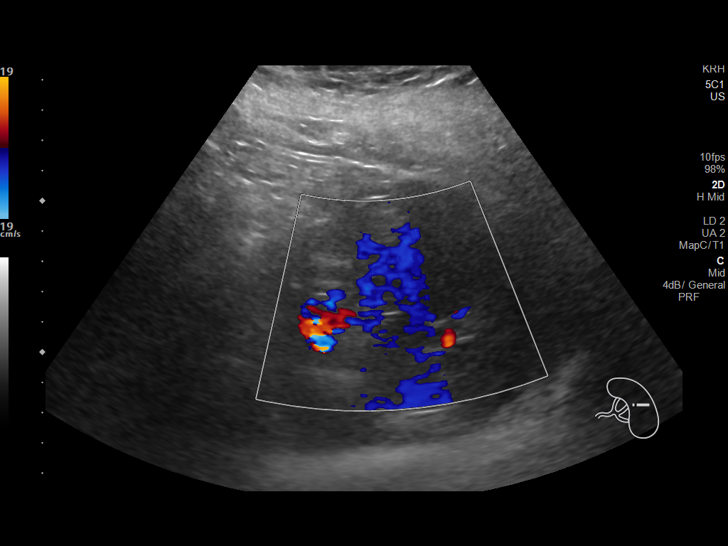
[im 42/63]
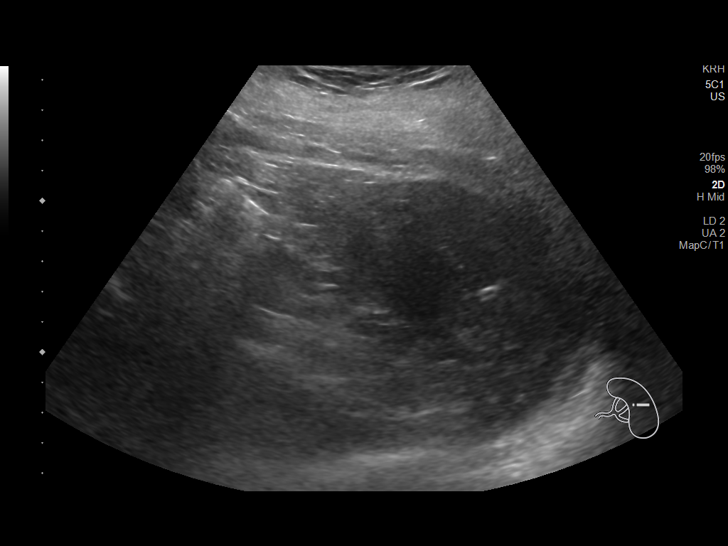
[im 47/63]
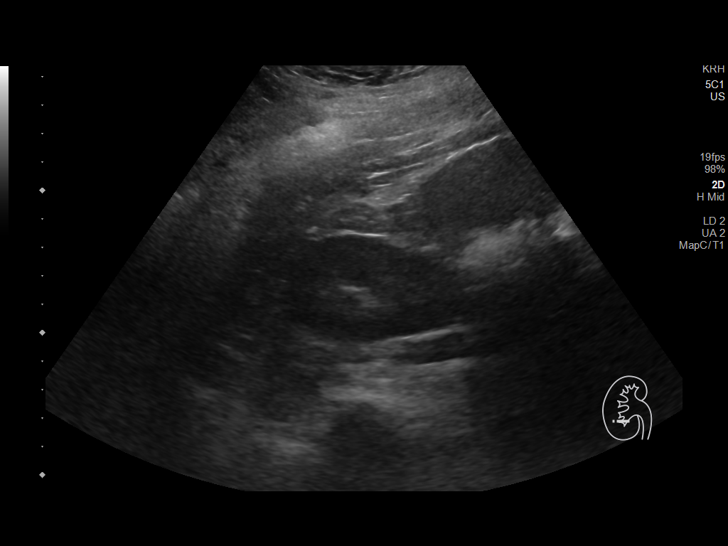
[im 52/63]
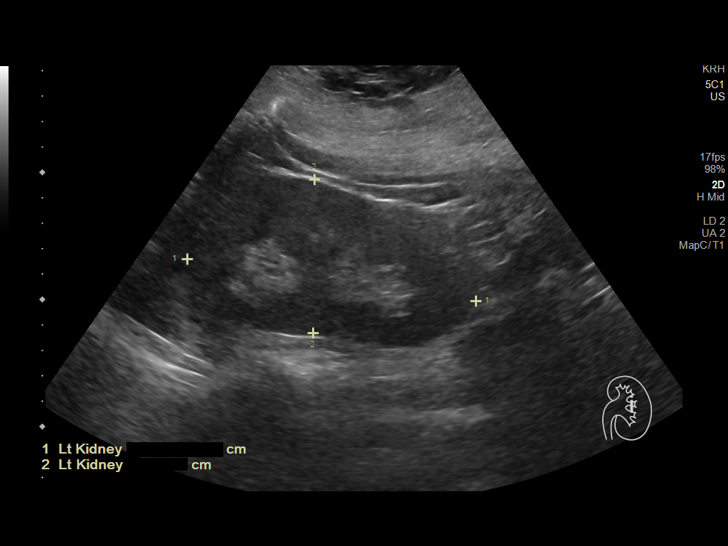
[im 57/63]
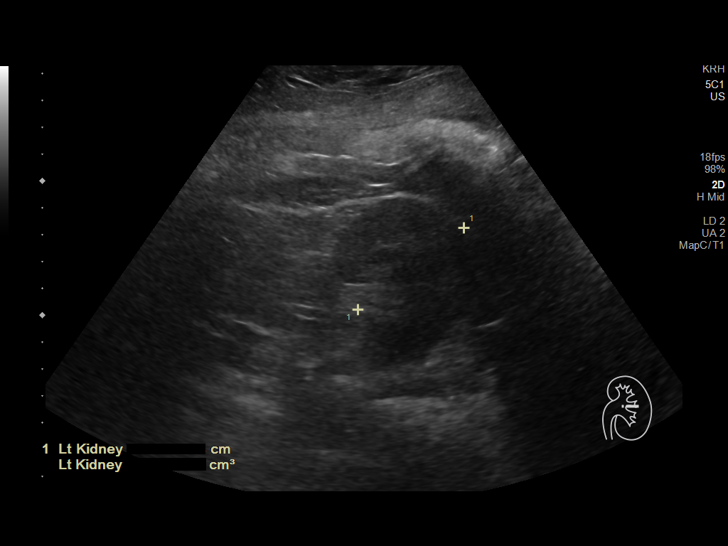
[im 63/63]
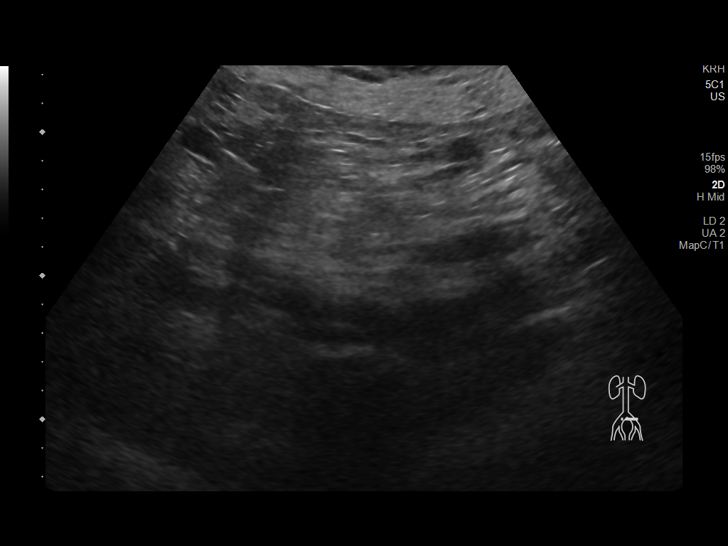

[14 of 25 positions shown; findings below may reference images not displayed]

FINDINGS: Gallbladder: No gallstones or wall thickening visualized. No
sonographic Murphy sign noted by sonographer.

Common bile duct: Diameter: 2 mm, within normal limits.

Liver: No focal lesion identified. Within normal limits in
parenchymal echogenicity. Portal vein is patent on color Doppler
imaging with normal direction of blood flow towards the liver.

IVC: No abnormality visualized.

Pancreas: Visualized portion unremarkable.

Spleen: Size and appearance within normal limits.

Right Kidney: Length: 13.1 cm. Echogenicity within normal limits. No
mass or hydronephrosis visualized.

Left Kidney: Length: 11.5 cm. Echogenicity within normal limits. No
mass or hydronephrosis.

Abdominal aorta: No aneurysm visualized.

Other findings: None.
IMPRESSION: Negative abdomen ultrasound.

## 2020-10-05 IMAGING — DX DG ABDOMEN ACUTE W/ 1V CHEST
5 series · 5 of 5 positions shown · non-contrast
Comparison: Chest x-ray 02/26/2016

CLINICAL DATA: Vomiting with abdominal pain

EXAM:
DG ABDOMEN ACUTE W/ 1V CHEST

[chest pa]
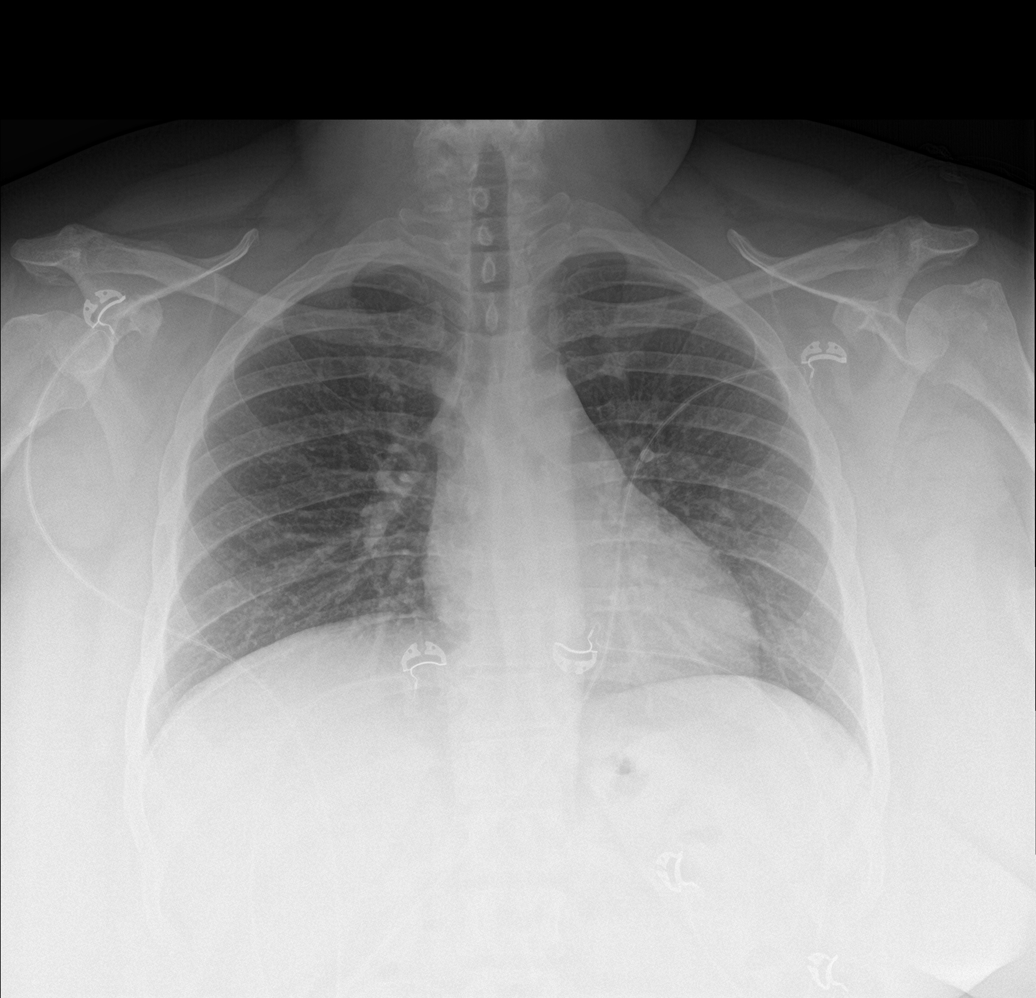

[abdomen erect (1 of 2)]
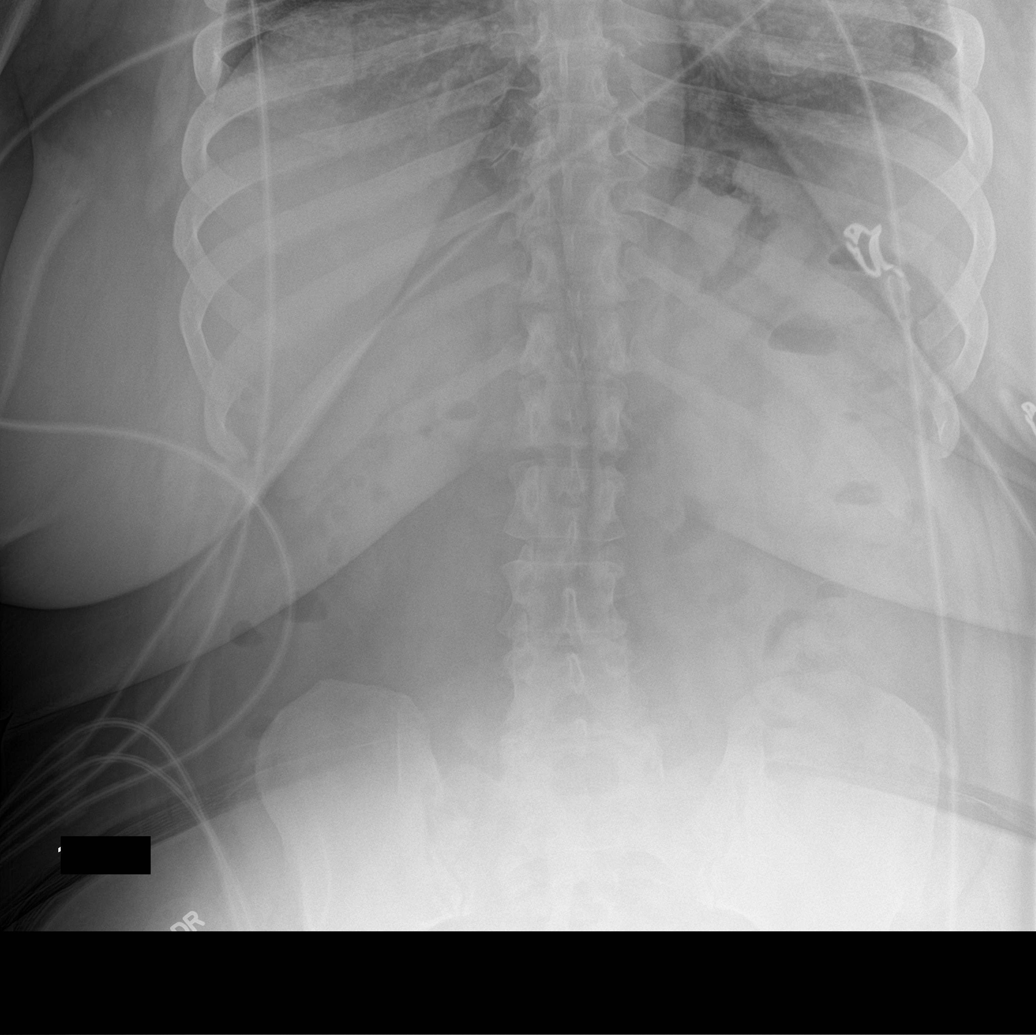

[abdomen kub (1 of 2)]
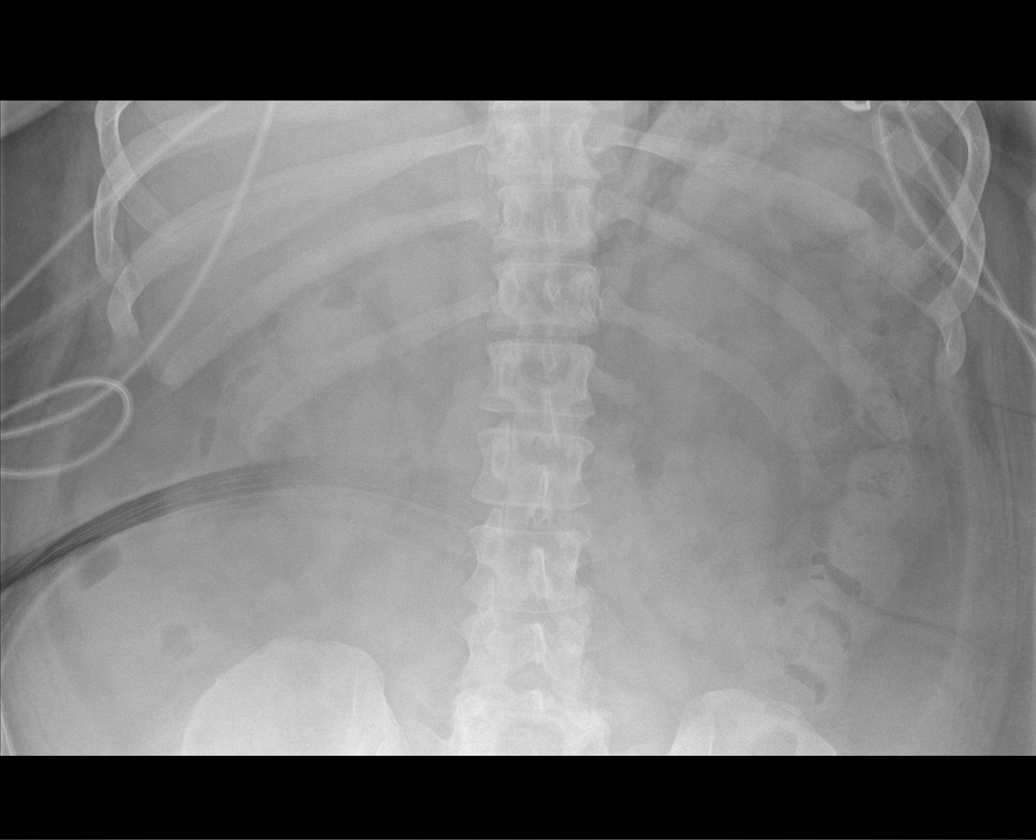

[abdomen erect (2 of 2)]
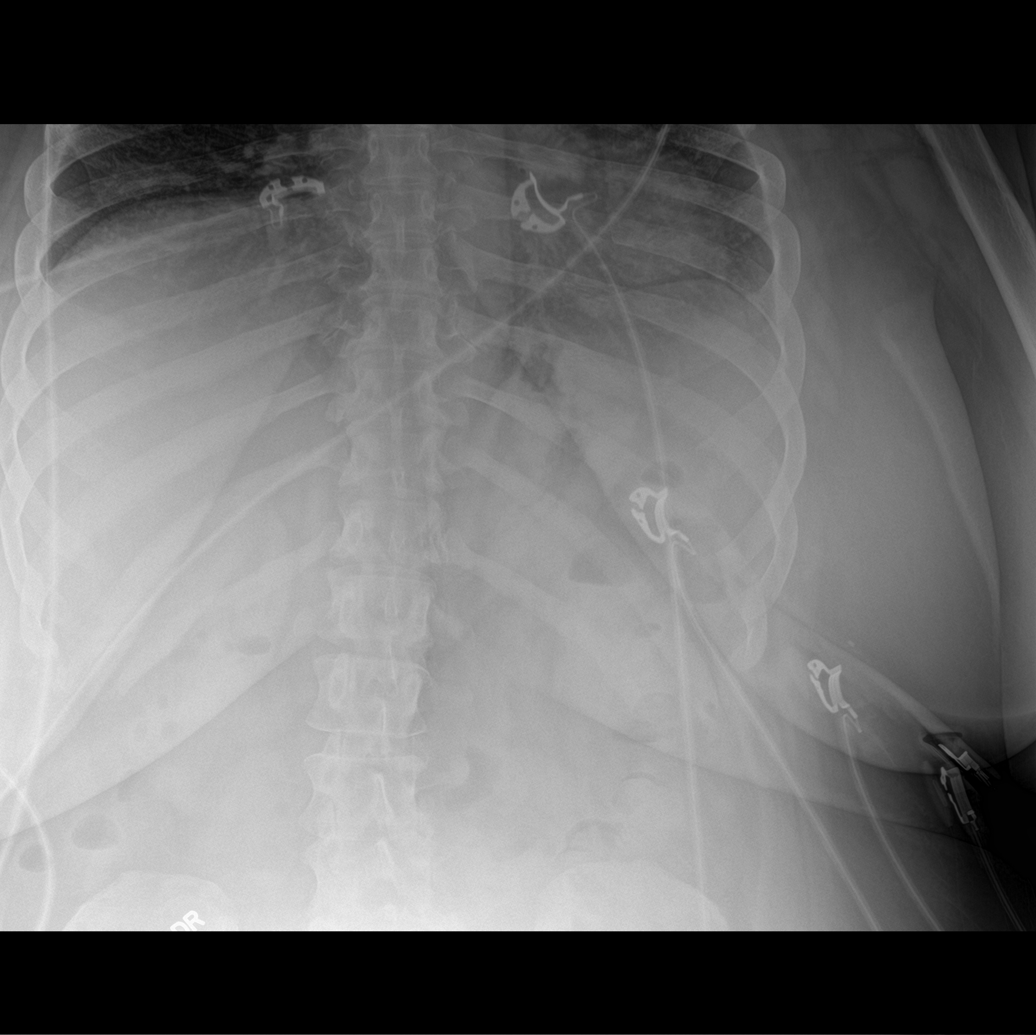

[abdomen kub (2 of 2)]
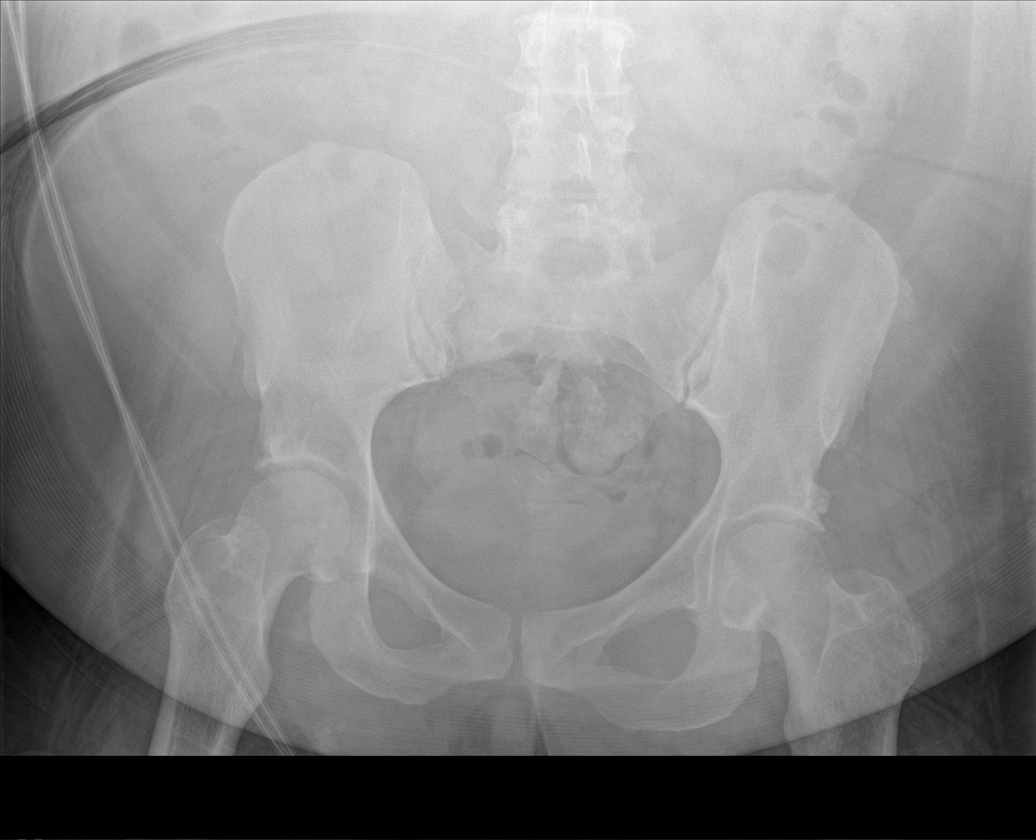

[5 of 5 positions shown; findings below may reference images not displayed]

FINDINGS: Single-view chest demonstrates no focal airspace disease or
effusion. Normal heart size. No pneumothorax.

Supine and upright views of the abdomen demonstrate a nonobstructed
bowel gas pattern. No radiopaque calculi. No free air beneath the
diaphragm
IMPRESSION: Negative abdominal radiographs.  No acute cardiopulmonary disease.

## 2022-04-28 DIAGNOSIS — H4312 Vitreous hemorrhage, left eye: Secondary | ICD-10-CM | POA: Diagnosis not present

## 2022-04-28 DIAGNOSIS — E113512 Type 2 diabetes mellitus with proliferative diabetic retinopathy with macular edema, left eye: Secondary | ICD-10-CM | POA: Diagnosis not present

## 2022-04-28 DIAGNOSIS — H3589 Other specified retinal disorders: Secondary | ICD-10-CM | POA: Diagnosis not present

## 2022-04-28 DIAGNOSIS — E113211 Type 2 diabetes mellitus with mild nonproliferative diabetic retinopathy with macular edema, right eye: Secondary | ICD-10-CM | POA: Diagnosis not present

## 2022-06-02 DIAGNOSIS — E113512 Type 2 diabetes mellitus with proliferative diabetic retinopathy with macular edema, left eye: Secondary | ICD-10-CM | POA: Diagnosis not present

## 2022-06-02 DIAGNOSIS — E113211 Type 2 diabetes mellitus with mild nonproliferative diabetic retinopathy with macular edema, right eye: Secondary | ICD-10-CM | POA: Diagnosis not present

## 2022-06-02 DIAGNOSIS — H35353 Cystoid macular degeneration, bilateral: Secondary | ICD-10-CM | POA: Diagnosis not present

## 2022-06-02 DIAGNOSIS — H4312 Vitreous hemorrhage, left eye: Secondary | ICD-10-CM | POA: Diagnosis not present

## 2022-06-09 DIAGNOSIS — F4323 Adjustment disorder with mixed anxiety and depressed mood: Secondary | ICD-10-CM | POA: Diagnosis not present

## 2022-06-23 DIAGNOSIS — E113512 Type 2 diabetes mellitus with proliferative diabetic retinopathy with macular edema, left eye: Secondary | ICD-10-CM | POA: Diagnosis not present

## 2022-06-23 DIAGNOSIS — H4312 Vitreous hemorrhage, left eye: Secondary | ICD-10-CM | POA: Diagnosis not present

## 2022-06-23 DIAGNOSIS — F4323 Adjustment disorder with mixed anxiety and depressed mood: Secondary | ICD-10-CM | POA: Diagnosis not present

## 2022-06-23 DIAGNOSIS — E113211 Type 2 diabetes mellitus with mild nonproliferative diabetic retinopathy with macular edema, right eye: Secondary | ICD-10-CM | POA: Diagnosis not present

## 2022-06-23 DIAGNOSIS — Z794 Long term (current) use of insulin: Secondary | ICD-10-CM | POA: Diagnosis not present

## 2022-07-14 DIAGNOSIS — Z794 Long term (current) use of insulin: Secondary | ICD-10-CM | POA: Diagnosis not present

## 2022-07-14 DIAGNOSIS — H35353 Cystoid macular degeneration, bilateral: Secondary | ICD-10-CM | POA: Diagnosis not present

## 2022-07-14 DIAGNOSIS — H4312 Vitreous hemorrhage, left eye: Secondary | ICD-10-CM | POA: Diagnosis not present

## 2022-07-14 DIAGNOSIS — E113211 Type 2 diabetes mellitus with mild nonproliferative diabetic retinopathy with macular edema, right eye: Secondary | ICD-10-CM | POA: Diagnosis not present

## 2022-07-14 DIAGNOSIS — E113512 Type 2 diabetes mellitus with proliferative diabetic retinopathy with macular edema, left eye: Secondary | ICD-10-CM | POA: Diagnosis not present

## 2022-07-21 DIAGNOSIS — E1165 Type 2 diabetes mellitus with hyperglycemia: Secondary | ICD-10-CM | POA: Diagnosis not present

## 2022-07-21 DIAGNOSIS — R809 Proteinuria, unspecified: Secondary | ICD-10-CM | POA: Diagnosis not present

## 2022-07-21 DIAGNOSIS — M25572 Pain in left ankle and joints of left foot: Secondary | ICD-10-CM | POA: Diagnosis not present

## 2022-07-21 DIAGNOSIS — E785 Hyperlipidemia, unspecified: Secondary | ICD-10-CM | POA: Diagnosis not present

## 2022-07-21 DIAGNOSIS — Z6841 Body Mass Index (BMI) 40.0 and over, adult: Secondary | ICD-10-CM | POA: Diagnosis not present

## 2022-07-25 DIAGNOSIS — E785 Hyperlipidemia, unspecified: Secondary | ICD-10-CM | POA: Diagnosis not present

## 2022-07-25 DIAGNOSIS — R809 Proteinuria, unspecified: Secondary | ICD-10-CM | POA: Diagnosis not present

## 2022-07-25 DIAGNOSIS — E1165 Type 2 diabetes mellitus with hyperglycemia: Secondary | ICD-10-CM | POA: Diagnosis not present

## 2022-07-25 DIAGNOSIS — M25572 Pain in left ankle and joints of left foot: Secondary | ICD-10-CM | POA: Diagnosis not present

## 2022-08-04 DIAGNOSIS — R509 Fever, unspecified: Secondary | ICD-10-CM | POA: Diagnosis not present

## 2022-08-04 DIAGNOSIS — U071 COVID-19: Secondary | ICD-10-CM | POA: Diagnosis not present

## 2022-08-04 DIAGNOSIS — R059 Cough, unspecified: Secondary | ICD-10-CM | POA: Diagnosis not present

## 2022-08-04 DIAGNOSIS — J029 Acute pharyngitis, unspecified: Secondary | ICD-10-CM | POA: Diagnosis not present

## 2022-08-11 DIAGNOSIS — F4323 Adjustment disorder with mixed anxiety and depressed mood: Secondary | ICD-10-CM | POA: Diagnosis not present

## 2022-09-08 DIAGNOSIS — E113512 Type 2 diabetes mellitus with proliferative diabetic retinopathy with macular edema, left eye: Secondary | ICD-10-CM | POA: Diagnosis not present

## 2022-09-08 DIAGNOSIS — H35353 Cystoid macular degeneration, bilateral: Secondary | ICD-10-CM | POA: Diagnosis not present

## 2022-09-08 DIAGNOSIS — E113211 Type 2 diabetes mellitus with mild nonproliferative diabetic retinopathy with macular edema, right eye: Secondary | ICD-10-CM | POA: Diagnosis not present

## 2022-09-08 DIAGNOSIS — Z794 Long term (current) use of insulin: Secondary | ICD-10-CM | POA: Diagnosis not present

## 2022-09-08 DIAGNOSIS — H4312 Vitreous hemorrhage, left eye: Secondary | ICD-10-CM | POA: Diagnosis not present

## 2022-09-16 DIAGNOSIS — J069 Acute upper respiratory infection, unspecified: Secondary | ICD-10-CM | POA: Diagnosis not present

## 2022-09-16 DIAGNOSIS — J302 Other seasonal allergic rhinitis: Secondary | ICD-10-CM | POA: Diagnosis not present

## 2022-09-16 DIAGNOSIS — H6503 Acute serous otitis media, bilateral: Secondary | ICD-10-CM | POA: Diagnosis not present

## 2022-09-16 DIAGNOSIS — Z20822 Contact with and (suspected) exposure to covid-19: Secondary | ICD-10-CM | POA: Diagnosis not present

## 2022-10-24 DIAGNOSIS — G4700 Insomnia, unspecified: Secondary | ICD-10-CM | POA: Diagnosis not present

## 2022-10-24 DIAGNOSIS — R062 Wheezing: Secondary | ICD-10-CM | POA: Diagnosis not present

## 2022-10-24 DIAGNOSIS — U099 Post covid-19 condition, unspecified: Secondary | ICD-10-CM | POA: Diagnosis not present

## 2022-10-24 DIAGNOSIS — E1165 Type 2 diabetes mellitus with hyperglycemia: Secondary | ICD-10-CM | POA: Diagnosis not present

## 2022-10-24 DIAGNOSIS — Z6841 Body Mass Index (BMI) 40.0 and over, adult: Secondary | ICD-10-CM | POA: Diagnosis not present

## 2022-10-26 DIAGNOSIS — F4323 Adjustment disorder with mixed anxiety and depressed mood: Secondary | ICD-10-CM | POA: Diagnosis not present

## 2022-11-16 DIAGNOSIS — F4323 Adjustment disorder with mixed anxiety and depressed mood: Secondary | ICD-10-CM | POA: Diagnosis not present

## 2022-11-21 DIAGNOSIS — Z20822 Contact with and (suspected) exposure to covid-19: Secondary | ICD-10-CM | POA: Diagnosis not present

## 2022-11-21 DIAGNOSIS — R051 Acute cough: Secondary | ICD-10-CM | POA: Diagnosis not present

## 2022-11-23 DIAGNOSIS — I10 Essential (primary) hypertension: Secondary | ICD-10-CM | POA: Diagnosis not present

## 2022-11-23 DIAGNOSIS — G4700 Insomnia, unspecified: Secondary | ICD-10-CM | POA: Diagnosis not present

## 2022-12-08 DIAGNOSIS — H4312 Vitreous hemorrhage, left eye: Secondary | ICD-10-CM | POA: Diagnosis not present

## 2022-12-08 DIAGNOSIS — E113512 Type 2 diabetes mellitus with proliferative diabetic retinopathy with macular edema, left eye: Secondary | ICD-10-CM | POA: Diagnosis not present

## 2022-12-08 DIAGNOSIS — E113211 Type 2 diabetes mellitus with mild nonproliferative diabetic retinopathy with macular edema, right eye: Secondary | ICD-10-CM | POA: Diagnosis not present

## 2022-12-08 DIAGNOSIS — H35041 Retinal micro-aneurysms, unspecified, right eye: Secondary | ICD-10-CM | POA: Diagnosis not present

## 2022-12-08 DIAGNOSIS — Z794 Long term (current) use of insulin: Secondary | ICD-10-CM | POA: Diagnosis not present

## 2022-12-14 DIAGNOSIS — F4323 Adjustment disorder with mixed anxiety and depressed mood: Secondary | ICD-10-CM | POA: Diagnosis not present

## 2023-01-06 DIAGNOSIS — E1165 Type 2 diabetes mellitus with hyperglycemia: Secondary | ICD-10-CM | POA: Diagnosis not present

## 2023-01-06 DIAGNOSIS — M1711 Unilateral primary osteoarthritis, right knee: Secondary | ICD-10-CM | POA: Diagnosis not present

## 2023-01-06 DIAGNOSIS — G8929 Other chronic pain: Secondary | ICD-10-CM | POA: Diagnosis not present

## 2023-01-06 DIAGNOSIS — M25561 Pain in right knee: Secondary | ICD-10-CM | POA: Diagnosis not present

## 2023-02-22 DIAGNOSIS — J310 Chronic rhinitis: Secondary | ICD-10-CM | POA: Diagnosis not present

## 2023-03-30 DIAGNOSIS — H2513 Age-related nuclear cataract, bilateral: Secondary | ICD-10-CM | POA: Diagnosis not present

## 2023-03-30 DIAGNOSIS — H35353 Cystoid macular degeneration, bilateral: Secondary | ICD-10-CM | POA: Diagnosis not present

## 2023-03-30 DIAGNOSIS — H35041 Retinal micro-aneurysms, unspecified, right eye: Secondary | ICD-10-CM | POA: Diagnosis not present

## 2023-03-30 DIAGNOSIS — Z794 Long term (current) use of insulin: Secondary | ICD-10-CM | POA: Diagnosis not present

## 2023-03-30 DIAGNOSIS — E113211 Type 2 diabetes mellitus with mild nonproliferative diabetic retinopathy with macular edema, right eye: Secondary | ICD-10-CM | POA: Diagnosis not present

## 2023-03-30 DIAGNOSIS — E113512 Type 2 diabetes mellitus with proliferative diabetic retinopathy with macular edema, left eye: Secondary | ICD-10-CM | POA: Diagnosis not present

## 2023-03-30 DIAGNOSIS — H4312 Vitreous hemorrhage, left eye: Secondary | ICD-10-CM | POA: Diagnosis not present

## 2023-04-10 DIAGNOSIS — G4700 Insomnia, unspecified: Secondary | ICD-10-CM | POA: Diagnosis not present

## 2023-04-10 DIAGNOSIS — Z1231 Encounter for screening mammogram for malignant neoplasm of breast: Secondary | ICD-10-CM | POA: Diagnosis not present

## 2023-04-10 DIAGNOSIS — N644 Mastodynia: Secondary | ICD-10-CM | POA: Diagnosis not present

## 2023-04-10 DIAGNOSIS — D229 Melanocytic nevi, unspecified: Secondary | ICD-10-CM | POA: Diagnosis not present

## 2023-04-28 DIAGNOSIS — E1165 Type 2 diabetes mellitus with hyperglycemia: Secondary | ICD-10-CM | POA: Diagnosis not present

## 2023-04-28 DIAGNOSIS — Z0000 Encounter for general adult medical examination without abnormal findings: Secondary | ICD-10-CM | POA: Diagnosis not present

## 2023-05-03 DIAGNOSIS — R92313 Mammographic fatty tissue density, bilateral breasts: Secondary | ICD-10-CM | POA: Diagnosis not present

## 2023-05-03 DIAGNOSIS — N644 Mastodynia: Secondary | ICD-10-CM | POA: Diagnosis not present

## 2023-05-04 DIAGNOSIS — Z0000 Encounter for general adult medical examination without abnormal findings: Secondary | ICD-10-CM | POA: Diagnosis not present

## 2023-05-04 DIAGNOSIS — H2513 Age-related nuclear cataract, bilateral: Secondary | ICD-10-CM | POA: Diagnosis not present

## 2023-05-04 DIAGNOSIS — M25572 Pain in left ankle and joints of left foot: Secondary | ICD-10-CM | POA: Diagnosis not present

## 2023-05-04 DIAGNOSIS — H4312 Vitreous hemorrhage, left eye: Secondary | ICD-10-CM | POA: Diagnosis not present

## 2023-05-04 DIAGNOSIS — H35041 Retinal micro-aneurysms, unspecified, right eye: Secondary | ICD-10-CM | POA: Diagnosis not present

## 2023-05-04 DIAGNOSIS — E113512 Type 2 diabetes mellitus with proliferative diabetic retinopathy with macular edema, left eye: Secondary | ICD-10-CM | POA: Diagnosis not present

## 2023-05-04 DIAGNOSIS — R5383 Other fatigue: Secondary | ICD-10-CM | POA: Diagnosis not present

## 2023-05-04 DIAGNOSIS — E559 Vitamin D deficiency, unspecified: Secondary | ICD-10-CM | POA: Diagnosis not present

## 2023-05-04 DIAGNOSIS — E785 Hyperlipidemia, unspecified: Secondary | ICD-10-CM | POA: Diagnosis not present

## 2023-05-04 DIAGNOSIS — E113211 Type 2 diabetes mellitus with mild nonproliferative diabetic retinopathy with macular edema, right eye: Secondary | ICD-10-CM | POA: Diagnosis not present

## 2023-05-04 DIAGNOSIS — H35353 Cystoid macular degeneration, bilateral: Secondary | ICD-10-CM | POA: Diagnosis not present

## 2023-05-04 DIAGNOSIS — Z794 Long term (current) use of insulin: Secondary | ICD-10-CM | POA: Diagnosis not present

## 2023-05-04 DIAGNOSIS — F339 Major depressive disorder, recurrent, unspecified: Secondary | ICD-10-CM | POA: Diagnosis not present

## 2023-05-04 DIAGNOSIS — E1165 Type 2 diabetes mellitus with hyperglycemia: Secondary | ICD-10-CM | POA: Diagnosis not present

## 2023-05-04 DIAGNOSIS — I10 Essential (primary) hypertension: Secondary | ICD-10-CM | POA: Diagnosis not present

## 2023-05-11 DIAGNOSIS — Z794 Long term (current) use of insulin: Secondary | ICD-10-CM | POA: Diagnosis not present

## 2023-05-11 DIAGNOSIS — E113512 Type 2 diabetes mellitus with proliferative diabetic retinopathy with macular edema, left eye: Secondary | ICD-10-CM | POA: Diagnosis not present

## 2023-05-11 DIAGNOSIS — H35353 Cystoid macular degeneration, bilateral: Secondary | ICD-10-CM | POA: Diagnosis not present

## 2023-06-01 DIAGNOSIS — H4312 Vitreous hemorrhage, left eye: Secondary | ICD-10-CM | POA: Diagnosis not present

## 2023-06-01 DIAGNOSIS — E113211 Type 2 diabetes mellitus with mild nonproliferative diabetic retinopathy with macular edema, right eye: Secondary | ICD-10-CM | POA: Diagnosis not present

## 2023-06-01 DIAGNOSIS — H35041 Retinal micro-aneurysms, unspecified, right eye: Secondary | ICD-10-CM | POA: Diagnosis not present

## 2023-06-01 DIAGNOSIS — E113512 Type 2 diabetes mellitus with proliferative diabetic retinopathy with macular edema, left eye: Secondary | ICD-10-CM | POA: Diagnosis not present

## 2023-07-12 DIAGNOSIS — Z01419 Encounter for gynecological examination (general) (routine) without abnormal findings: Secondary | ICD-10-CM | POA: Diagnosis not present

## 2023-07-12 DIAGNOSIS — N951 Menopausal and female climacteric states: Secondary | ICD-10-CM | POA: Diagnosis not present

## 2023-07-12 DIAGNOSIS — Z79890 Hormone replacement therapy (postmenopausal): Secondary | ICD-10-CM | POA: Diagnosis not present

## 2023-07-13 DIAGNOSIS — M7662 Achilles tendinitis, left leg: Secondary | ICD-10-CM | POA: Diagnosis not present

## 2023-07-13 DIAGNOSIS — E119 Type 2 diabetes mellitus without complications: Secondary | ICD-10-CM | POA: Diagnosis not present

## 2023-07-13 DIAGNOSIS — M79672 Pain in left foot: Secondary | ICD-10-CM | POA: Diagnosis not present

## 2023-07-13 DIAGNOSIS — S9032XS Contusion of left foot, sequela: Secondary | ICD-10-CM | POA: Diagnosis not present

## 2023-08-03 DIAGNOSIS — H2513 Age-related nuclear cataract, bilateral: Secondary | ICD-10-CM | POA: Diagnosis not present

## 2023-08-03 DIAGNOSIS — Z794 Long term (current) use of insulin: Secondary | ICD-10-CM | POA: Diagnosis not present

## 2023-08-03 DIAGNOSIS — H4312 Vitreous hemorrhage, left eye: Secondary | ICD-10-CM | POA: Diagnosis not present

## 2023-08-03 DIAGNOSIS — E113512 Type 2 diabetes mellitus with proliferative diabetic retinopathy with macular edema, left eye: Secondary | ICD-10-CM | POA: Diagnosis not present

## 2023-08-03 DIAGNOSIS — H35353 Cystoid macular degeneration, bilateral: Secondary | ICD-10-CM | POA: Diagnosis not present

## 2023-08-03 DIAGNOSIS — E113211 Type 2 diabetes mellitus with mild nonproliferative diabetic retinopathy with macular edema, right eye: Secondary | ICD-10-CM | POA: Diagnosis not present

## 2023-08-10 DIAGNOSIS — Z1211 Encounter for screening for malignant neoplasm of colon: Secondary | ICD-10-CM | POA: Diagnosis not present

## 2023-08-10 DIAGNOSIS — K317 Polyp of stomach and duodenum: Secondary | ICD-10-CM | POA: Diagnosis not present

## 2023-08-10 DIAGNOSIS — Z860101 Personal history of adenomatous and serrated colon polyps: Secondary | ICD-10-CM | POA: Diagnosis not present

## 2023-08-10 DIAGNOSIS — K635 Polyp of colon: Secondary | ICD-10-CM | POA: Diagnosis not present

## 2023-08-24 DIAGNOSIS — J0100 Acute maxillary sinusitis, unspecified: Secondary | ICD-10-CM | POA: Diagnosis not present

## 2023-08-24 DIAGNOSIS — R051 Acute cough: Secondary | ICD-10-CM | POA: Diagnosis not present

## 2023-09-07 DIAGNOSIS — E113211 Type 2 diabetes mellitus with mild nonproliferative diabetic retinopathy with macular edema, right eye: Secondary | ICD-10-CM | POA: Diagnosis not present

## 2023-09-07 DIAGNOSIS — H4312 Vitreous hemorrhage, left eye: Secondary | ICD-10-CM | POA: Diagnosis not present

## 2023-09-07 DIAGNOSIS — H35353 Cystoid macular degeneration, bilateral: Secondary | ICD-10-CM | POA: Diagnosis not present

## 2023-09-07 DIAGNOSIS — E113512 Type 2 diabetes mellitus with proliferative diabetic retinopathy with macular edema, left eye: Secondary | ICD-10-CM | POA: Diagnosis not present

## 2023-10-12 DIAGNOSIS — H4312 Vitreous hemorrhage, left eye: Secondary | ICD-10-CM | POA: Diagnosis not present

## 2023-10-12 DIAGNOSIS — E113512 Type 2 diabetes mellitus with proliferative diabetic retinopathy with macular edema, left eye: Secondary | ICD-10-CM | POA: Diagnosis not present

## 2023-10-12 DIAGNOSIS — E113211 Type 2 diabetes mellitus with mild nonproliferative diabetic retinopathy with macular edema, right eye: Secondary | ICD-10-CM | POA: Diagnosis not present

## 2023-10-12 DIAGNOSIS — H2513 Age-related nuclear cataract, bilateral: Secondary | ICD-10-CM | POA: Diagnosis not present

## 2023-10-12 DIAGNOSIS — H35353 Cystoid macular degeneration, bilateral: Secondary | ICD-10-CM | POA: Diagnosis not present

## 2023-10-12 DIAGNOSIS — Z794 Long term (current) use of insulin: Secondary | ICD-10-CM | POA: Diagnosis not present

## 2023-11-01 DIAGNOSIS — F332 Major depressive disorder, recurrent severe without psychotic features: Secondary | ICD-10-CM | POA: Diagnosis not present

## 2023-11-01 DIAGNOSIS — E1165 Type 2 diabetes mellitus with hyperglycemia: Secondary | ICD-10-CM | POA: Diagnosis not present

## 2023-11-01 DIAGNOSIS — Z6841 Body Mass Index (BMI) 40.0 and over, adult: Secondary | ICD-10-CM | POA: Diagnosis not present

## 2023-11-01 DIAGNOSIS — E785 Hyperlipidemia, unspecified: Secondary | ICD-10-CM | POA: Diagnosis not present

## 2023-11-01 DIAGNOSIS — I10 Essential (primary) hypertension: Secondary | ICD-10-CM | POA: Diagnosis not present

## 2023-11-16 DIAGNOSIS — Z794 Long term (current) use of insulin: Secondary | ICD-10-CM | POA: Diagnosis not present

## 2023-11-16 DIAGNOSIS — H4312 Vitreous hemorrhage, left eye: Secondary | ICD-10-CM | POA: Diagnosis not present

## 2023-11-16 DIAGNOSIS — E113512 Type 2 diabetes mellitus with proliferative diabetic retinopathy with macular edema, left eye: Secondary | ICD-10-CM | POA: Diagnosis not present

## 2023-11-16 DIAGNOSIS — E113211 Type 2 diabetes mellitus with mild nonproliferative diabetic retinopathy with macular edema, right eye: Secondary | ICD-10-CM | POA: Diagnosis not present

## 2024-01-04 DIAGNOSIS — E113512 Type 2 diabetes mellitus with proliferative diabetic retinopathy with macular edema, left eye: Secondary | ICD-10-CM | POA: Diagnosis not present

## 2024-01-04 DIAGNOSIS — H2513 Age-related nuclear cataract, bilateral: Secondary | ICD-10-CM | POA: Diagnosis not present

## 2024-01-04 DIAGNOSIS — E113211 Type 2 diabetes mellitus with mild nonproliferative diabetic retinopathy with macular edema, right eye: Secondary | ICD-10-CM | POA: Diagnosis not present

## 2024-01-04 DIAGNOSIS — H4312 Vitreous hemorrhage, left eye: Secondary | ICD-10-CM | POA: Diagnosis not present

## 2024-01-04 DIAGNOSIS — Z794 Long term (current) use of insulin: Secondary | ICD-10-CM | POA: Diagnosis not present

## 2024-03-07 DIAGNOSIS — E113211 Type 2 diabetes mellitus with mild nonproliferative diabetic retinopathy with macular edema, right eye: Secondary | ICD-10-CM | POA: Diagnosis not present

## 2024-03-07 DIAGNOSIS — H2513 Age-related nuclear cataract, bilateral: Secondary | ICD-10-CM | POA: Diagnosis not present

## 2024-03-07 DIAGNOSIS — H4312 Vitreous hemorrhage, left eye: Secondary | ICD-10-CM | POA: Diagnosis not present

## 2024-03-07 DIAGNOSIS — Z794 Long term (current) use of insulin: Secondary | ICD-10-CM | POA: Diagnosis not present

## 2024-03-07 DIAGNOSIS — E113512 Type 2 diabetes mellitus with proliferative diabetic retinopathy with macular edema, left eye: Secondary | ICD-10-CM | POA: Diagnosis not present
